# Patient Record
Sex: Male | Born: 1985 | Race: White | Hispanic: No | Marital: Married | Smoking: Former smoker
Health system: Southern US, Community
[De-identification: ages and names within clinical notes are randomized; demographics above are authoritative.]

## PROBLEM LIST (undated history)

## (undated) DIAGNOSIS — C6290 Malignant neoplasm of unspecified testis, unspecified whether descended or undescended: Secondary | ICD-10-CM

## (undated) DIAGNOSIS — G43909 Migraine, unspecified, not intractable, without status migrainosus: Secondary | ICD-10-CM

## (undated) DIAGNOSIS — E7800 Pure hypercholesterolemia, unspecified: Secondary | ICD-10-CM

## (undated) HISTORY — DX: Pure hypercholesterolemia, unspecified: E78.00

## (undated) HISTORY — PX: TUMOR REMOVAL: SHX12

## (undated) HISTORY — DX: Malignant neoplasm of unspecified testis, unspecified whether descended or undescended: C62.90

## (undated) HISTORY — DX: Migraine, unspecified, not intractable, without status migrainosus: G43.909

---

## 2005-11-05 DIAGNOSIS — C6290 Malignant neoplasm of unspecified testis, unspecified whether descended or undescended: Secondary | ICD-10-CM

## 2005-11-05 DIAGNOSIS — I82629 Acute embolism and thrombosis of deep veins of unspecified upper extremity: Secondary | ICD-10-CM

## 2005-11-05 HISTORY — DX: Acute embolism and thrombosis of deep veins of unspecified upper extremity: I82.629

## 2005-11-05 HISTORY — DX: Malignant neoplasm of unspecified testis, unspecified whether descended or undescended: C62.90

## 2012-10-06 ENCOUNTER — Telehealth: Payer: Self-pay | Admitting: Oncology

## 2012-10-06 NOTE — Telephone Encounter (Signed)
S/W PT IN REF TO NP APPT. ON 10/10/12@1 :30 RE[address]ING [address]. [name] DX-RESECTION OF MEDIASTINAL SEMINOMA 7 YRS AGO EMAILED NP PACKED

## 2012-10-06 NOTE — Telephone Encounter (Signed)
C/D 10/06/12 for appt.10/10/12 °

## 2012-10-07 ENCOUNTER — Other Ambulatory Visit: Payer: Self-pay | Admitting: Oncology

## 2012-10-07 DIAGNOSIS — C6290 Malignant neoplasm of unspecified testis, unspecified whether descended or undescended: Secondary | ICD-10-CM

## 2012-10-10 ENCOUNTER — Telehealth: Payer: Self-pay | Admitting: Oncology

## 2012-10-10 ENCOUNTER — Encounter: Payer: Self-pay | Admitting: Oncology

## 2012-10-10 ENCOUNTER — Other Ambulatory Visit (HOSPITAL_BASED_OUTPATIENT_CLINIC_OR_DEPARTMENT_OTHER): Payer: Managed Care, Other (non HMO) | Admitting: Lab

## 2012-10-10 ENCOUNTER — Ambulatory Visit: Payer: Managed Care, Other (non HMO)

## 2012-10-10 ENCOUNTER — Ambulatory Visit (HOSPITAL_BASED_OUTPATIENT_CLINIC_OR_DEPARTMENT_OTHER): Payer: Managed Care, Other (non HMO) | Admitting: Oncology

## 2012-10-10 VITALS — BP 104/58 | HR 80 | Temp 98.5°F | Resp 20 | Ht 66.5 in | Wt 136.8 lb

## 2012-10-10 DIAGNOSIS — C6290 Malignant neoplasm of unspecified testis, unspecified whether descended or undescended: Secondary | ICD-10-CM

## 2012-10-10 DIAGNOSIS — G894 Chronic pain syndrome: Secondary | ICD-10-CM

## 2012-10-10 LAB — COMPREHENSIVE METABOLIC PANEL (CC13)
Albumin: 4.5 g/dL (ref 3.5–5.0)
CO2: 26 mEq/L (ref 22–29)
Calcium: 9.7 mg/dL (ref 8.4–10.4)
Chloride: 104 mEq/L (ref 98–107)
Glucose: 135 mg/dl — ABNORMAL HIGH (ref 70–99)
Potassium: 3.6 mEq/L (ref 3.5–5.1)
Sodium: 140 mEq/L (ref 136–145)
Total Protein: 7.5 g/dL (ref 6.4–8.3)

## 2012-10-10 LAB — CBC WITH DIFFERENTIAL/PLATELET
Eosinophils Absolute: 0 10*3/uL (ref 0.0–0.5)
LYMPH%: 32.3 % (ref 14.0–49.0)
MONO#: 0.3 10*3/uL (ref 0.1–0.9)
NEUT#: 2.8 10*3/uL (ref 1.5–6.5)
Platelets: 182 10*3/uL (ref 140–400)
RBC: 5.04 10*6/uL (ref 4.20–5.82)
WBC: 4.7 10*3/uL (ref 4.0–10.3)
lymph#: 1.5 10*3/uL (ref 0.9–3.3)

## 2012-10-10 MED ORDER — HYDROCODONE-ACETAMINOPHEN 5-325 MG PO TABS: 1.0000 | ORAL_TABLET | Freq: Four times a day (QID) | ORAL | Status: DC | PRN

## 2012-10-10 NOTE — Progress Notes (Signed)
Note dictated

## 2012-10-10 NOTE — Telephone Encounter (Signed)
gv pt appt schedule for Dec 2014....Marland Kitchentried to schedule the referral however April @ [facility] [address] stated that they do not have any Dr. Claiborne Billings deals with that type of medical condition.Marland Kitcheni s/w Dr. Clelia Croft and he stated that he would handle it.Marland Kitcheni did not authorize the referral

## 2012-10-10 NOTE — Progress Notes (Signed)
Checked in new pt with no financial concerns. °

## 2012-10-11 NOTE — Progress Notes (Signed)
CC:   Alexander Rivera. Alexander Rivera, M.D.  REASON FOR CONSULTATION:  History of seminoma.  HISTORY OF PRESENT ILLNESS:  Alexander Rivera is a pleasant 26 year old gentleman, native of [place], [place], currently of [employer]. He is a very nice gentleman, currently works at [employer] and has been living in this area for the last 2 years due to his job.  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  He had a large mediastinal mass that was biopsy-proven to be seminoma.  He was treated initially with BEP chemotherapy and subsequently had thoracic surgery to remove residual mass.  That was complicated by superior vena cava syndrome; however, he recovered fairly well and has been disease-free since that time.  He has been followed by Dr. Lutricia Rivera in [place], [place].  Since his operation, he has developed chronic pain syndrome that has been tried on Percocet as well as low- dose Neurontin without really any significant benefit.  The patient relocated to [facility] and had been doing relatively fair.  He does have chronic pain but has gotten worse in the last year or so.  For that reason he was evaluated by an urgent care and was referred to me to reestablish care with Oncology.  Clinically, Alexander Rivera feels relatively well.  He is actually able to work full time without any hindrance or decline.  He is able to exercise.  He does have more of a dull, aching pain in the middle of his back and upper shoulders, rating somewhere between 3-5/10 and dull in nature, not radiating, and again alleviated slightly by hydrocodone.  He is not reporting any peripheral neuropathy. He has not reported any weakness, fatigue, or tiredness.  He did not report any chest pain, did not report any shortness of breath.  Had not had any testicular masses.  REVIEW OF SYSTEMS:  Does not report any headaches, blurred vision, double vision.  Does not report any motor or sensory  neuropathy.  Does not report any alteration in mental status.  Does not report any psychiatric issues, depression.  Does not report any fever, chills, sweats.  Does not report any cough, hemoptysis, hematemesis.  No nausea, vomiting.  Does not report any abdominal pain, hematochezia, melena, genitourinary complaints.  The rest of the review of systems is unremarkable.  PAST MEDICAL HISTORY:  Significant for germ cell tumor, presented with anterior mediastinal mass, treated with surgery with BEP chemotherapy followed by resection.  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  No history of hypertension, diabetes, coronary disease.  CURRENT MEDICATION:  He is on hydrocodone with Tylenol.  ALLERGIES:  None.  SOCIAL HISTORY:  He is married.  He does not have any alcohol or tobacco abuse.  No children.  FAMILY HISTORY:  No cancers in the immediate family, but on his father's side, there is kidney cancer and breast cancer on his mother's side.  PHYSICAL EXAMINATION:  Alert, awake gentleman appeared in no active distress.  His blood pressure today is 104/58, pulse is 80, respirations 20, temperature is 98.  Weight is 135 pounds.  ECOG performance status is 0.  HEENT:  Head is normocephalic, atraumatic.  Pupils equal, round, reactive to light.  Oral mucosa moist and pink.  Neck:  Supple without adenopathy.  Heart:  Regular rate and rhythm.  S1, S2.  Lungs:  Clear to auscultation.  No rhonchi, wheezing, dullness to percussion.  Abdomen: Soft, nontender.  No hepatosplenomegaly.  Extremities:  No clubbing,  cyanosis, or edema.  Neurologic:  Intact motor, sensory, and deep tendon reflexes.  LABORATORY DATA:  Today showed a hemoglobin of 15, white cell count of 4.7, platelet count 182.  He had normal chemistry, normal liver function tests.  Tumor markers currently pending.  ASSESSMENT/PLAN:  A 26 year old gentleman presented with the following issues: 1. He has a  history of a mediastinal seminoma presented with an     anterior mediastinal mass.  Treated with chemotherapy followed by     surgical resection.  I had a lengthy discussion today discussing     the natural course of these particular tumors as well as the     treatment for it.  Certainly, he had an excellent treatment and     appears to be in remission.  His last CT scan was done in 2011,     done in [place], [place], and had no evidence of any     recurrent disease.  At this time, I will continue his active     surveillance.  Given the fact that he has not had any imaging     studies, given the fact that he had increased pain, I would like to     image his chest 1 more time.  If his chest is clear at this time, I     would do annual chest x-rays up until 10 years, which will be taken     to 2017.  Once that has continued to be disease-free at that time,     we will suspend any imaging studies.  I have continued to advise     him to continue with age-appropriate cancer screening, and he will     follow up with me annually. 2. Chronic pain syndrome.  Undoubtedly related to his tumor and     surgery.  At this time, I think it would Alexander Rivera to see a     physical medicine and rehab and chronic pain clinic from that     standpoint, as we are probably looking at a life-term need for pain     Medication. I will make the referral today.  I     will communicate the findings of his imaging studies once those are     completed.    ______________________________ Alexander Rivera, M.D. FNS/MEDQ  D:  10/10/2012  T:  10/11/2012  Job:  [id number]

## 2012-10-13 LAB — BETA HCG QUANT (REF LAB): Beta hCG, Tumor Marker: <0.5 m[IU]/mL (ref <5.0)

## 2012-10-13 LAB — AFP TUMOR MARKER: AFP-Tumor Marker: 2.3 ng/mL (ref 0.0–8.0)

## 2012-10-14 ENCOUNTER — Encounter: Payer: Self-pay | Admitting: *Deleted

## 2012-10-14 NOTE — Progress Notes (Signed)
Faxed patient's chart to anne, @ [facility].  # [phone]. Patient notified.

## 2012-10-24 NOTE — Progress Notes (Signed)
Faxed progress notes, demographics, and recent labs to [facility] [phone]--fax / [phone]--office)

## 2012-11-03 ENCOUNTER — Encounter: Payer: Self-pay | Admitting: *Deleted

## 2012-11-03 NOTE — Progress Notes (Signed)
Called [facility] and asked when patient would be scheduled? Was told that his insurance [employer], was not in their network. i called patient and asked him to call the customer service # on the back of his ins card and ask what pain clinics they will cover. He will call me back, after speaking with customer service.

## 2012-11-03 NOTE — Progress Notes (Deleted)
Called [facility] and asked when patient would be scheduled? Was told that his insurance [employer], was not in their network. i called patient and asked him to call the customer service # on the back of his ins card and ask what pain clinics they will cover. He will call me back, after speaking with customer service. 

## 2012-11-04 ENCOUNTER — Telehealth: Payer: Self-pay | Admitting: *Deleted

## 2012-11-04 ENCOUNTER — Other Ambulatory Visit: Payer: Self-pay | Admitting: *Deleted

## 2012-11-04 DIAGNOSIS — C6290 Malignant neoplasm of unspecified testis, unspecified whether descended or undescended: Secondary | ICD-10-CM

## 2012-11-04 MED ORDER — HYDROCODONE-ACETAMINOPHEN 5-325 MG PO TABS: 1.0000 | ORAL_TABLET | Freq: Four times a day (QID) | ORAL | Status: DC | PRN

## 2012-11-04 NOTE — Telephone Encounter (Signed)
Wife Aram Beecham calling to say she has a list of medical facilities that are in the [facility] network. Patients O.V. Notes faxed to [facility] [address]  Fax [phone]. They have several clinics [place] and that is why the fax is a [phone] #.

## 2012-11-04 NOTE — Telephone Encounter (Signed)
Per dr Clelia Croft okay to call in vicodin [phone] until patient can get into the pain clinic. Patient notified.

## 2013-10-13 ENCOUNTER — Ambulatory Visit: Payer: Managed Care, Other (non HMO) | Admitting: Oncology

## 2013-10-13 ENCOUNTER — Other Ambulatory Visit: Payer: Self-pay | Admitting: Oncology

## 2013-10-13 ENCOUNTER — Other Ambulatory Visit: Payer: Managed Care, Other (non HMO)

## 2013-10-13 DIAGNOSIS — C6290 Malignant neoplasm of unspecified testis, unspecified whether descended or undescended: Secondary | ICD-10-CM

## 2013-12-24 ENCOUNTER — Emergency Department (HOSPITAL_COMMUNITY)
Admission: EM | Admit: 2013-12-24 | Discharge: 2013-12-24 | Disposition: A | Discharge status: Home / Self Care | Payer: Managed Care, Other (non HMO) | Attending: Emergency Medicine | Admitting: Emergency Medicine

## 2013-12-24 ENCOUNTER — Encounter (HOSPITAL_COMMUNITY): Payer: Self-pay | Admitting: Emergency Medicine

## 2013-12-24 DIAGNOSIS — T628X1A Toxic effect of other specified noxious substances eaten as food, accidental (unintentional), initial encounter: Secondary | ICD-10-CM | POA: Insufficient documentation

## 2013-12-24 DIAGNOSIS — Y929 Unspecified place or not applicable: Secondary | ICD-10-CM | POA: Insufficient documentation

## 2013-12-24 DIAGNOSIS — Y9389 Activity, other specified: Secondary | ICD-10-CM | POA: Insufficient documentation

## 2013-12-24 DIAGNOSIS — T7840XA Allergy, unspecified, initial encounter: Secondary | ICD-10-CM

## 2013-12-24 DIAGNOSIS — R209 Unspecified disturbances of skin sensation: Secondary | ICD-10-CM | POA: Insufficient documentation

## 2013-12-24 DIAGNOSIS — L509 Urticaria, unspecified: Secondary | ICD-10-CM | POA: Insufficient documentation

## 2013-12-24 DIAGNOSIS — Z87891 Personal history of nicotine dependence: Secondary | ICD-10-CM | POA: Insufficient documentation

## 2013-12-24 LAB — BASIC METABOLIC PANEL
BUN: 19 mg/dL (ref 6–23)
CO2: 25 mEq/L (ref 19–32)
Calcium: 9.9 mg/dL (ref 8.4–10.5)
Chloride: 99 mEq/L (ref 96–112)
Creatinine, Ser: 0.86 mg/dL (ref 0.50–1.35)
GFR calc non Af Amer: >90 mL/min (ref >90)
Glucose, Bld: 107 mg/dL — ABNORMAL HIGH (ref 70–99)
Potassium: 3.8 mEq/L (ref 3.7–5.3)
SODIUM: 138 meq/L (ref 137–147)

## 2013-12-24 LAB — CBC WITH DIFFERENTIAL/PLATELET
Basophils Absolute: 0 10*3/uL (ref 0.0–0.1)
Basophils Relative: 0 % (ref 0–1)
Eosinophils Absolute: 0.1 10*3/uL (ref 0.0–0.7)
Eosinophils Relative: 1 % (ref 0–5)
HCT: 46.1 % (ref 39.0–52.0)
HEMOGLOBIN: 16.1 g/dL (ref 13.0–17.0)
LYMPHS ABS: 2.4 10*3/uL (ref 0.7–4.0)
Lymphocytes Relative: 38 % (ref 12–46)
MCH: 30.6 pg (ref 26.0–34.0)
MCHC: 34.9 g/dL (ref 30.0–36.0)
MCV: 87.5 fL (ref 78.0–100.0)
MONOS PCT: 7 % (ref 3–12)
Monocytes Absolute: 0.4 10*3/uL (ref 0.1–1.0)
NEUTROS PCT: 54 % (ref 43–77)
Neutro Abs: 3.4 10*3/uL (ref 1.7–7.7)
Platelets: 205 10*3/uL (ref 150–400)
RBC: 5.27 MIL/uL (ref 4.22–5.81)
RDW: 13.6 % (ref 11.5–15.5)
WBC: 6.3 10*3/uL (ref 4.0–10.5)

## 2013-12-24 MED ORDER — DIPHENHYDRAMINE HCL 50 MG/ML IJ SOLN
25.0000 mg | Freq: Once | INTRAMUSCULAR | Status: AC
  Administered 2013-12-24: 25 mg via INTRAVENOUS
  Filled 2013-12-24: qty 1

## 2013-12-24 MED ORDER — METHYLPREDNISOLONE SODIUM SUCC 125 MG IJ SOLR
125.0000 mg | Freq: Once | INTRAMUSCULAR | Status: AC
  Administered 2013-12-24: 125 mg via INTRAVENOUS
  Filled 2013-12-24: qty 2

## 2013-12-24 MED ORDER — SODIUM CHLORIDE 0.9 % IV SOLN
Freq: Once | INTRAVENOUS | Status: AC
  Administered 2013-12-24: 22:00:00 via INTRAVENOUS

## 2013-12-24 MED ORDER — FAMOTIDINE IN NACL 20-0.9 MG/50ML-% IV SOLN
20.0000 mg | Freq: Once | INTRAVENOUS | Status: AC
  Administered 2013-12-24: 20 mg via INTRAVENOUS
  Filled 2013-12-24: qty 50

## 2013-12-24 NOTE — ED Provider Notes (Signed)
CSN: [id number]     Arrival date & time 12/24/13  2138 History   First MD Initiated Contact with Patient 12/24/13 2148     Chief Complaint  Patient presents with  . Allergic Reaction     (Consider location/radiation/quality/duration/timing/severity/associated sxs/prior Treatment) HPI Comments: Patient is a 28 year old male who presents with a suspected allergic reaction about 20 minutes prior to arrival. Symptoms started gradually and progressively worsened since the onset. Patient reports lip tingling, bilateral hand tingling, generalized skin erythema, and itching. Patient is present with his wife who states they ate [place] food about 1.5 hours prior to arrival. Patient has a known allergy to bee venom and no other allergies. The only recent medication change was a generic Clonidine patch from the brand name he used to use that he switched earlier today. Patient denies any other new detergents, lotions, soaps. Patient denies any wheezing, SOB, throat closing.    History reviewed. No pertinent past medical history. Past Surgical History  Procedure Laterality Date  . Tumor removal      Chest   No family history on file. History  Substance Use Topics  . Smoking status: Former Smoker    Types: Cigarettes    Quit date: 11/05/2008  . Smokeless tobacco: Never Used  . Alcohol Use: 0.6 oz/week    1 Cans of beer per week    Review of Systems  Constitutional: Negative for fever, chills and fatigue.  HENT: Negative for trouble swallowing.   Eyes: Negative for visual disturbance.  Respiratory: Negative for shortness of breath.   Cardiovascular: Negative for chest pain and palpitations.  Gastrointestinal: Negative for nausea, vomiting, abdominal pain and diarrhea.  Genitourinary: Negative for dysuria and difficulty urinating.  Musculoskeletal: Negative for arthralgias and neck pain.  Skin: Positive for rash. Negative for color change.  Neurological: Negative for dizziness and weakness.   Psychiatric/Behavioral: Negative for dysphoric mood.      Allergies  Review of patient's allergies indicates no known allergies.  Home Medications   Current Outpatient Rx  Name  Route  Sig  Dispense  Refill  . HYDROcodone-acetaminophen (NORCO/VICODIN) 5-325 MG per tablet   Oral   Take 1 tablet by mouth every 6 (six) hours as needed.   30 tablet   0    BP 132/89  Temp(Src) 98.3 F (36.8 C) (Oral)  Resp 15  Ht 5\' 7"  (1.702 m)  Wt 135 lb (61.236 kg)  BMI 21.14 kg/m2  SpO2 98% Physical Exam  Nursing note and vitals reviewed. Constitutional: He is oriented to person, place, and time. He appears well-developed and well-nourished. No distress.  HENT:  Head: Normocephalic and atraumatic.  Mouth/Throat: Oropharynx is clear and moist. No oropharyngeal exudate.  Eyes: Conjunctivae and EOM are normal.  Neck: Normal range of motion.  Cardiovascular: Normal rate and regular rhythm.  Exam reveals no gallop and no friction rub.   No murmur heard. Pulmonary/Chest: Effort normal and breath sounds normal. He has no wheezes. He has no rales. He exhibits no tenderness.  Abdominal: Soft. There is no tenderness.  Musculoskeletal: Normal range of motion.  Neurological: He is alert and oriented to person, place, and time. Coordination normal.  Speech is goal-oriented. Moves limbs without ataxia.   Skin: Skin is warm and dry.  Generalized erythema and scattered urticarial lesions of back, abdomen, chest, neck and face. No lip or tongue edema noted.   Psychiatric: He has a normal mood and affect. His behavior is normal.    ED Course  Procedures (including critical care time) Labs Review Labs Reviewed  BASIC METABOLIC PANEL - Abnormal; Notable for the following:    Glucose, Bld 107 (*)    All other components within normal limits  CBC WITH DIFFERENTIAL   Imaging Review No results found.  EKG Interpretation   None       MDM   Final diagnoses:  Allergic reaction    9:55  PM Patient will have solumedrol, benadryl, and pepcid for allergic reaction. Vitals stable and patient afebrile. Patient not wheezing, having throat closing, or having any signs of respiratory distress.  11:17 PM Patient symptoms have resolved and he is feeling better. Patient will follow up with his PCP for further evaluation and possible medication changes. No further evaluation needed at this time.     Alvina Chou, PA-C 12/24/13 2319

## 2013-12-24 NOTE — Discharge Instructions (Signed)
Follow up with your doctor. Take benadryl at home if symptoms return. Return to the ED with worsening or concerning symptoms.

## 2013-12-24 NOTE — ED Notes (Signed)
Pt reports that he started having lip tingling, bilateral hand tingling, generalized redness, and generalized itching about twenty minutes prior to arrival. Pt reports hives everywhere. Pt reports an allergy to bee venom, however that was as a child. Pt denies any other known allergies.

## 2013-12-27 NOTE — ED Provider Notes (Signed)
Medical screening examination/treatment/procedure(s) were performed by non-physician practitioner and as supervising physician I was immediately available for consultation/collaboration.  EKG Interpretation   None         Houston Siren III, MD 12/27/13 [phone]

## 2015-12-29 ENCOUNTER — Ambulatory Visit (INDEPENDENT_AMBULATORY_CARE_PROVIDER_SITE_OTHER): Payer: Managed Care, Other (non HMO) | Admitting: Family Medicine

## 2015-12-29 VITALS — BP 114/64 | HR 91 | Temp 98.7°F | Resp 16 | Ht 67.0 in | Wt 159.0 lb

## 2015-12-29 DIAGNOSIS — G894 Chronic pain syndrome: Secondary | ICD-10-CM

## 2015-12-29 DIAGNOSIS — Z9889 Other specified postprocedural states: Secondary | ICD-10-CM

## 2015-12-29 MED ORDER — PREGABALIN 75 MG PO CAPS: 75.0000 mg | ORAL_CAPSULE | Freq: Three times a day (TID) | ORAL | Status: DC

## 2015-12-29 NOTE — Progress Notes (Signed)
By signing my name below, I, Moises Blood, attest that this documentation has been prepared under the direction and in the presence of Robyn Haber, MD. Electronically Signed: Moises Blood, Elida. 12/29/2015 , 4:25 PM .  Patient was seen in room 12 .   Patient ID: Alexander Rivera MRN: [id number], DOB: 1986-09-20, 30 y.o. Date of Encounter: 12/29/2015  Primary Physician: Gelene Mink, MD  Chief Complaint:  Chief Complaint  Patient presents with  . Medication Refill    Lyrica    HPI:  Daishaun Mawhinney is a 30 y.o. male who presents to [facility] for medication refill on lyrica. He had cancer (mediastinal seminoma) when he was 30 years old. He had back surgery and there was a lot of residual pain. He takes lyric 75mg  tid. He's a patient at [facility] pain management, but his insurance charges the visit as specialist. So, he was advised to come to urgent care and family medicine for the prescription.   He works at [employer], in the Psychologist, educational.   History reviewed. No pertinent past medical history.   Home Meds: Prior to Admission medications   Medication Sig Start Date End Date Taking? Authorizing Provider  cloNIDine (CATAPRES - DOSED IN MG/24 HR) 0.1 mg/24hr patch Place 0.1 mg onto the skin once a week. Reported on 12/29/2015 12/23/13   Historical Provider, MD  oxyCODONE (OXY IR/ROXICODONE) 5 MG immediate release tablet Take 5 mg by mouth 2 (two) times daily. Reported on 12/29/2015 12/11/13   Historical Provider, MD  ranitidine (ZANTAC) 150 MG tablet Take 150 mg by mouth 2 (two) times daily as needed for heartburn. Reported on 12/29/2015    Historical Provider, MD    Allergies: No Known Allergies  Social History   Social History  . Marital Status: Married    Spouse Name: N/A  . Number of Children: N/A  . Years of Education: N/A   Occupational History  . Not on file.   Social History Main Topics  . Smoking status: Former Smoker    Types: Cigarettes      Quit date: 11/05/2008  . Smokeless tobacco: Never Used  . Alcohol Use: 0.6 oz/week    1 Cans of beer per week  . Drug Use: No  . Sexual Activity: Not on file   Other Topics Concern  . Not on file   Social History Narrative     Review of Systems: Constitutional: negative for fever, chills, night sweats, weight changes, or fatigue  HEENT: negative for vision changes, hearing loss, congestion, rhinorrhea, ST, epistaxis, or sinus pressure Cardiovascular: negative for chest pain or palpitations Respiratory: negative for hemoptysis, wheezing, shortness of breath, or cough Abdominal: negative for abdominal pain, nausea, vomiting, diarrhea, or constipation Dermatological: negative for rash Musc: positive for back pain Neurologic: negative for headache, dizziness, or syncope All other systems reviewed and are otherwise negative with the exception to those above and in the HPI.  Physical Exam: Blood pressure 114/64, pulse 91, temperature 98.7 F (37.1 C), resp. rate 16, height 5\' 7"  (1.702 m), weight 159 lb (72.122 kg), SpO2 98 %., Body mass index is 24.9 kg/(m^2). General: Well developed, well nourished, in no acute distress. Head: Normocephalic, atraumatic, eyes without discharge, sclera non-icteric, nares are without discharge. Bilateral auditory canals clear, TM's are without perforation, pearly grey and translucent with reflective cone of light bilaterally. Oral cavity moist, posterior pharynx without exudate, erythema, peritonsillar abscess, or post nasal drip.  Neck: Supple. No thyromegaly. Full ROM. No  lymphadenopathy. Lungs: Clear bilaterally to auscultation without wheezes, rales, or rhonchi. Breathing is unlabored. Heart: RRR with S1 S2. No murmurs, rubs, or gallops appreciated. Abdomen: Soft, non-tender, non-distended with normoactive bowel sounds. No hepatomegaly. No rebound/guarding. No obvious abdominal masses. Msk:  Strength and tone normal for age. Extremities/Skin: Warm  and dry. No clubbing or cyanosis. No edema. No rashes or suspicious lesions. Neuro: Alert and oriented X 3. Moves all extremities spontaneously. Gait is normal. CNII-XII grossly in tact. Psych:  Responds to questions appropriately with a normal affect.    ASSESSMENT AND PLAN:  30 y.o. year old male with  This chart was scribed in my presence and reviewed by me personally.    ICD-9-CM ICD-10-CM   1. Chronic pain syndrome 338.4 G89.4 pregabalin (LYRICA) 75 MG capsule  2. S/P thoracotomy V45.89 [id number]      Signed, Robyn Haber, MD     Signed, Robyn Haber, MD 12/29/2015 4:25 PM

## 2016-03-29 ENCOUNTER — Telehealth: Payer: Self-pay

## 2016-03-29 DIAGNOSIS — G894 Chronic pain syndrome: Secondary | ICD-10-CM

## 2016-03-29 MED ORDER — PREGABALIN 75 MG PO CAPS: 75.0000 mg | ORAL_CAPSULE | Freq: Three times a day (TID) | ORAL | Status: DC

## 2016-03-29 NOTE — Telephone Encounter (Signed)
Pt called in concerning his prescription Lyrica. He came in on 12/29/15 and saw Dr. Joseph Art for a medication refill of this medication. It was suppose to be sent in by mail order but it never was. Pt is now in need of the medication and would like this refill done. I advised patient of Dr. Pauletta Browns retiring and we would have to put in a message in concerning the refill. Pt's wife called back in very I rate saying that we dropped the ball on this one & she refuses to wait 24-48 hours for a response. Pt states that they will be in to take care of this mess. Gave wife office hours to stop by whenever it was convenient.

## 2016-03-29 NOTE — Telephone Encounter (Signed)
SPoke with pt, advised I  Rx was sent in again.

## 2016-10-15 ENCOUNTER — Telehealth: Payer: Self-pay | Admitting: Family Medicine

## 2016-10-15 ENCOUNTER — Ambulatory Visit (INDEPENDENT_AMBULATORY_CARE_PROVIDER_SITE_OTHER): Payer: Managed Care, Other (non HMO) | Admitting: Physician Assistant

## 2016-10-15 VITALS — BP 122/78 | HR 82 | Temp 98.6°F | Resp 17 | Ht 67.0 in | Wt 159.0 lb

## 2016-10-15 DIAGNOSIS — G894 Chronic pain syndrome: Secondary | ICD-10-CM | POA: Diagnosis not present

## 2016-10-15 MED ORDER — PREGABALIN 75 MG PO CAPS: 75.0000 mg | ORAL_CAPSULE | Freq: Three times a day (TID) | ORAL | 3 refills | Status: DC

## 2016-10-15 NOTE — Patient Instructions (Signed)
     IF you received an x-ray today, you will receive an invoice from [facility]. Please contact [facility] at [phone] with questions or concerns regarding your invoice.   IF you received labwork today, you will receive an invoice from [facility]. Please contact [facility] at [phone] with questions or concerns regarding your invoice.   Our billing staff will not be able to assist you with questions regarding bills from these companies.  You will be contacted with the lab results as soon as they are available. The fastest way to get your results is to activate your My Chart account. Instructions are located on the last page of this paperwork. If you have not heard from us regarding the results in 2 weeks, please contact this office.      

## 2016-10-15 NOTE — Progress Notes (Signed)
   10/15/2016 5:54 PM   DOB: June 03, 1986 / MRN: [id number]  SUBJECTIVE:  Alexander Rivera is a 30 y.o. male presenting for lyrica refills.  REports a history of mass occupying lesion in his chest at 30 years of age.  He was living in [place] that required surgery at the [facility] clinic along with chemotherapy.  He did not require radiation. Reports a history of chornic pain 2/2 to this and has required narcotics in the past however has found lyrica controls his symptoms. Denies any dizziness and narcolepsy with lyrica.  He has not been missing doses.  He has also tried tylenol and ibuprofen that does not really helped.   He has No Known Allergies.   He  has no past medical history on file.    He  reports that he quit smoking about 7 years ago. His smoking use included Cigarettes. He has never used smokeless tobacco. He reports that he drinks about 0.6 oz of alcohol per week . He reports that he does not use drugs. He  has no sexual activity history on file. The patient  has a past surgical history that includes Tumor removal.  His family history is not on file.  Review of Systems  Constitutional: Negative for fever.  Respiratory: Negative for sputum production and shortness of breath.   Cardiovascular: Negative for leg swelling.  Musculoskeletal: Negative for myalgias.  Neurological: Negative for dizziness.    The problem list and medications were reviewed and updated by myself where necessary and exist elsewhere in the encounter.   OBJECTIVE:  BP 122/78 (BP Location: Right Arm, Patient Position: Sitting, Cuff Size: Normal)   Pulse 82   Temp 98.6 F (37 C) (Oral)   Resp 17   Ht 5\' 7"  (1.702 m)   Wt 159 lb (72.1 kg)   SpO2 100%   BMI 24.90 kg/m   Physical Exam  Constitutional: He is oriented to person, place, and time. He appears well-developed and well-nourished. No distress.  Cardiovascular: Normal rate, regular rhythm and normal heart sounds.   Pulmonary/Chest: Effort normal  and breath sounds normal.    Musculoskeletal: Normal range of motion.  Neurological: He is alert and oriented to person, place, and time.  Skin: Skin is warm. He is not diaphoretic.  Psychiatric: He has a normal mood and affect.    No results found for this or any previous visit (from the past 72 hour(s)).  No results found.  ASSESSMENT AND PLAN  Alexander Rivera was seen today for medication refill.  Diagnoses and all orders for this visit:  Chronic pain syndrome: He is well managed on pregabalin.  I will maintain this for him as he is well controlled and is not taking any narcotics. He will come back in one year. Will consider adding duloxetine to his regimen to potentially reduce the dose of pregabalin, however he is already taking less than 1/2 the maximum dose.  -     pregabalin (LYRICA) 75 MG capsule; Take 1 capsule (75 mg total) by mouth 3 (three) times daily.    The patient is advised to call or return to clinic if he does not see an improvement in symptoms, or to seek the care of the closest emergency department if he worsens with the above plan.   Philis Fendt, MHS, PA-C [facility] 10/15/2016 5:54 PM

## 2016-10-15 NOTE — Addendum Note (Signed)
Addended by: Candie Chroman D on: 10/15/2016 07:11 PM   Modules accepted: Orders

## 2016-10-15 NOTE — Telephone Encounter (Signed)
Pt wife called stating that [facility] wants Alexander Rivera to escript his RX for 90 day Lyrica instead of faxing RX I faxed it earlier for Alexander Rivera pt will bring RX back here tonight

## 2016-10-15 NOTE — Telephone Encounter (Signed)
Called pt to let him know that RX will be called into [facility] first thing tomorrow morning

## 2017-07-10 ENCOUNTER — Ambulatory Visit (INDEPENDENT_AMBULATORY_CARE_PROVIDER_SITE_OTHER): Payer: Managed Care, Other (non HMO) | Admitting: Physician Assistant

## 2017-07-10 ENCOUNTER — Encounter: Payer: Self-pay | Admitting: Physician Assistant

## 2017-07-10 VITALS — BP 124/85 | HR 89 | Temp 98.2°F | Resp 16 | Ht 67.0 in | Wt 150.8 lb

## 2017-07-10 DIAGNOSIS — C6290 Malignant neoplasm of unspecified testis, unspecified whether descended or undescended: Secondary | ICD-10-CM

## 2017-07-10 DIAGNOSIS — L918 Other hypertrophic disorders of the skin: Secondary | ICD-10-CM | POA: Diagnosis not present

## 2017-07-10 DIAGNOSIS — G894 Chronic pain syndrome: Secondary | ICD-10-CM | POA: Diagnosis not present

## 2017-07-10 MED ORDER — PREGABALIN 75 MG PO CAPS: 75.0000 mg | ORAL_CAPSULE | Freq: Three times a day (TID) | ORAL | 3 refills | Status: DC

## 2017-07-10 MED ORDER — DULOXETINE HCL 20 MG PO CPEP: ORAL_CAPSULE | ORAL | 3 refills | Status: DC

## 2017-07-10 NOTE — Patient Instructions (Signed)
Continue lyrica at the same dose for three week.  After the third week eliminate the mid day dose.  After another three week eliminate the morning dose, and after another three weeks eliminate the evening dose.

## 2017-07-10 NOTE — Progress Notes (Signed)
    07/10/2017 3:07 PM   DOB: 1986-04-07 / MRN: [id number]  SUBJECTIVE:  Alexander Rivera is a 31 y.o. male presenting for recheck of chronic chest pain secondary to surgery.  Wants to try coming off gabaergics and try something that would not increase the risk of cognitive decline in the future.   Has a mole on the back of his head he would like addressed.   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    He has No Known Allergies.   He  has no past medical history on file.    He  reports that he quit smoking about 8 years ago. His smoking use included Cigarettes. He has never used smokeless tobacco. He reports that he drinks about 0.6 oz of alcohol per week . He reports that he does not use drugs. He  has no sexual activity history on file. The patient  has a past surgical history that includes Tumor removal.  His family history is not on file.  Review of Systems  Constitutional: Negative for chills and fever.  HENT: Negative for sore throat.   Genitourinary: Negative for dysuria.  Skin: Negative for itching and rash.  Neurological: Negative for dizziness.  Psychiatric/Behavioral: Negative for depression.    The problem list and medications were reviewed and updated by myself where necessary and exist elsewhere in the encounter.   OBJECTIVE:  BP 124/85 (BP Location: Right Arm, Patient Position: Sitting, Cuff Size: Normal)   Pulse 89   Temp 98.2 F (36.8 C) (Oral)   Resp 16   Ht 5\' 7"  (1.702 m)   Wt 150 lb 12.8 oz (68.4 kg)   SpO2 98%   BMI 23.62 kg/m   Physical Exam  Constitutional: He appears well-developed. He is active and cooperative.  Non-toxic appearance.  Cardiovascular: Normal rate.   Pulmonary/Chest: Effort normal. No tachypnea.  Neurological: He is alert.  Skin: Skin is warm and dry. He is not diaphoretic. No pallor.  Vitals reviewed.   No results found for this or any previous visit (from the past 72 hour(s)).  No results  found.  ASSESSMENT AND PLAN:  Alexander Rivera was seen today for medication refill and referral.  Diagnoses and all orders for this visit:  Seminoma [facility]) -     Ambulatory referral to Gynecologic Oncology -     hCG, serum, qualitative  Chronic pain syndrome: see avs.  Will attempt to take him off gabaergic over the next 9 weeks.  -     DULoxetine (CYMBALTA) 20 MG capsule; Start 20 mg for three weeks then start taking 40 mg and continue.  Always take in the morning. -     pregabalin (LYRICA) 75 MG capsule; Take 1 capsule (75 mg total) by mouth 3 (three) times daily.  Skin tag: Golden Circle off after administering numbing medication. Will send for path.  -     Surgical pathology    The patient is advised to call or return to clinic if he does not see an improvement in symptoms, or to seek the care of the closest emergency department if he worsens with the above plan.   Philis Fendt, MHS, PA-C [facility] 07/10/2017 3:07 PM

## 2017-07-10 NOTE — Addendum Note (Signed)
Addended by: Tereasa Coop on: 07/10/2017 06:53 PM   Modules accepted: Orders

## 2017-07-11 ENCOUNTER — Encounter: Payer: Self-pay | Admitting: Radiology

## 2017-07-11 ENCOUNTER — Telehealth: Payer: Self-pay | Admitting: Physician Assistant

## 2017-07-11 LAB — HCG, SERUM, QUALITATIVE: hCG,Beta Subunit,Qual,Serum: NEGATIVE m[IU]/mL

## 2017-07-11 MED ORDER — PREGABALIN 75 MG PO CAPS: 75.0000 mg | ORAL_CAPSULE | Freq: Three times a day (TID) | ORAL | 3 refills | Status: DC

## 2017-07-11 NOTE — Addendum Note (Signed)
Addended by: Tereasa Coop on: 07/11/2017 03:44 PM   Modules accepted: Orders

## 2017-07-11 NOTE — Telephone Encounter (Signed)
Routing error.  Sent to clinical pool.  Sorry!

## 2017-07-11 NOTE — Telephone Encounter (Signed)
Pt's wife called stating that [facility] has not received the script for lyrica.  Please resend script to [facility]

## 2017-07-12 NOTE — Telephone Encounter (Signed)
Rx faxed to [facility] on 07/11/2017./ S.Trayven Lumadue,CMA

## 2019-06-11 ENCOUNTER — Telehealth: Payer: Managed Care, Other (non HMO)

## 2019-06-16 ENCOUNTER — Telehealth: Payer: Self-pay

## 2019-06-16 NOTE — Telephone Encounter (Signed)
error 

## 2019-06-18 ENCOUNTER — Other Ambulatory Visit: Payer: Self-pay

## 2019-06-18 ENCOUNTER — Encounter: Payer: Self-pay | Admitting: Family Medicine

## 2019-06-18 ENCOUNTER — Ambulatory Visit (INDEPENDENT_AMBULATORY_CARE_PROVIDER_SITE_OTHER): Payer: Managed Care, Other (non HMO) | Admitting: Family Medicine

## 2019-06-18 VITALS — BP 110/78 | HR 115 | Temp 98.4°F | Ht 67.0 in | Wt 144.2 lb

## 2019-06-18 DIAGNOSIS — G8929 Other chronic pain: Secondary | ICD-10-CM | POA: Diagnosis not present

## 2019-06-18 DIAGNOSIS — C6290 Malignant neoplasm of unspecified testis, unspecified whether descended or undescended: Secondary | ICD-10-CM

## 2019-06-18 DIAGNOSIS — N50811 Right testicular pain: Secondary | ICD-10-CM | POA: Diagnosis not present

## 2019-06-18 DIAGNOSIS — Z113 Encounter for screening for infections with a predominantly sexual mode of transmission: Secondary | ICD-10-CM | POA: Diagnosis not present

## 2019-06-18 DIAGNOSIS — M545 Low back pain: Secondary | ICD-10-CM

## 2019-06-18 DIAGNOSIS — Z8547 Personal history of malignant neoplasm of testis: Secondary | ICD-10-CM

## 2019-06-18 LAB — POCT URINALYSIS DIP (PROADVANTAGE DEVICE)
Bilirubin, UA: NEGATIVE
Glucose, UA: NEGATIVE mg/dL
Ketones, POC UA: NEGATIVE mg/dL
Leukocytes, UA: NEGATIVE
Nitrite, UA: NEGATIVE
Protein Ur, POC: NEGATIVE mg/dL
Specific Gravity, Urine: 1.025
Urobilinogen, Ur: NEGATIVE
pH, UA: 6 (ref 5.0–8.0)

## 2019-06-18 MED ORDER — DOXYCYCLINE HYCLATE 100 MG PO TABS: 100.0000 mg | ORAL_TABLET | Freq: Two times a day (BID) | ORAL | 0 refills | Status: DC

## 2019-06-18 NOTE — Progress Notes (Signed)
Subjective:    Patient ID: Alexander Rivera, male    DOB: 13-Jul-1986, 33 y.o.   MRN: [id number]  HPI Chief Complaint  Patient presents with  . New Patient (Initial Visit)    etsbalish care and referral to oncology    He is new to the practice and here to establish care.  Requests referral back to Dr. Alen Blew and oncology due to history of testicular cancer. Previous medical care: none in the past 2 years.   Other providers: Has been under the care of Dr. Alen Blew, oncologist but has not been there in the past 5 years.  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 Had chemotherapy and surgery to remove the tumor at age 62. He had follow up annually and then  Surgery was done at the [facility].   Complains of pain in his right testicle for the past 2 months. Top of his right testicle is tender to palpation.  Pain is relatively constant.  He denies urinary symptoms.  No penile discharge. Requests STD testing.  He also has low back pain and states this was similar back pain as when he diagnosed with cancer.  Started years ago. Now becoming more frequent. Pain is worse with certain movements especially when bending over. Keeps him from sleeping lately.   Denies numbness, tingling, weakness.  No saddle anesthesia.  No urinary retention or loss of control of bowels or bladder.  Denies fever, chills, night sweats, fatigue, dizziness, unexplained weight loss, chest pain, palpitations, shortness of breath, cough, abdominal pain, nausea, vomiting, diarrhea.  Takes Excedrin.  States he has had PT for back pain as well as seen a Restaurant manager, fast food.   Social history: Lives with wife. No kids.  works as a Teacher, music for [employer].  Denies smoking, drinking alcohol, drug use   Reviewed allergies, medications, past medical, surgical, family, and social history.    Review of Systems  Pertinent positives and negatives in the history of present illness.      Objective:   Physical Exam BP 110/78   Pulse (!) 115   Temp 98.4 F (36.9 C)   Ht 5\' 7"  (1.702 m)   Wt 144 lb 3.2 oz (65.4 kg)   SpO2 95%   BMI 22.58 kg/m   Alert and in no distress.  Cardiac exam shows a regular rhythm without murmurs or gallops. Lungs are clear to auscultation.  Abdomen is soft, nondistended, normal bowel sounds, nontender, no guarding or rebound, no palpable masses.  GU exam with chaperone present reveals superior and slightly posterior right testicular tenderness over the epididymis.  Normal exam otherwise.       Assessment & Plan:  Pain in right testicle - Plan: CBC with Differential/Platelet, Comprehensive metabolic panel, POCT Urinalysis DIP (Proadvantage Device), RPR, HIV Antibody (routine testing w rflx), GC/Chlamydia Probe Amp, US SCROTUM W/DOPPLER, doxycycline (VIBRA-TABS) 100 MG tablet, Ambulatory referral to Urology, CANCELED: US Scrotum, history and exam consistent with epididymitis.  Treat with doxycycline.  Follow-up pending results.  He is aware that if his pain becomes severe and unrelenting then he should go to the emergency department for immediate evaluation.  Seminoma ([place]) - Plan: Ambulatory referral to Oncology, Ambulatory referral to Urology  Chronic midline low back pain without sciatica - Plan: CBC with Differential/Platelet, Comprehensive metabolic panel, POCT Urinalysis DIP (Proadvantage Device), DG Lumbar Spine Complete, appears to be musculoskeletal related.  Discussed supportive care.  Lumbar spine x-ray ordered.  Follow-up pending results.  Consider PT  versus Ortho referral.  Screen for STD (sexually transmitted disease) - Plan: GC/Chlamydia Probe Amp, Ambulatory referral to Oncology, started on doxycycline for epididymitis.  Follow-up pending results.  History of testicular cancer - Plan: Ambulatory referral to Oncology, Ambulatory referral to Urology, follow-up pending results of ultrasound and labs.

## 2019-06-19 LAB — COMPREHENSIVE METABOLIC PANEL
ALT: 21 IU/L (ref 0–44)
AST: 26 IU/L (ref 0–40)
Albumin/Globulin Ratio: 1.7 (ref 1.2–2.2)
Albumin: 4.9 g/dL (ref 4.0–5.0)
Alkaline Phosphatase: 73 IU/L (ref 39–117)
BUN/Creatinine Ratio: 11 (ref 9–20)
BUN: 11 mg/dL (ref 6–20)
Bilirubin Total: 0.7 mg/dL (ref 0.0–1.2)
CO2: 24 mmol/L (ref 20–29)
Calcium: 10.2 mg/dL (ref 8.7–10.2)
Chloride: 99 mmol/L (ref 96–106)
Creatinine, Ser: 1.01 mg/dL (ref 0.76–1.27)
GFR calc Af Amer: 112 mL/min/{1.73_m2} (ref >59)
GFR calc non Af Amer: 97 mL/min/{1.73_m2} (ref >59)
Globulin, Total: 2.9 g/dL (ref 1.5–4.5)
Glucose: 91 mg/dL (ref 65–99)
Potassium: 5 mmol/L (ref 3.5–5.2)
Sodium: 140 mmol/L (ref 134–144)
Total Protein: 7.8 g/dL (ref 6.0–8.5)

## 2019-06-19 LAB — CBC WITH DIFFERENTIAL/PLATELET
Basophils Absolute: 0 10*3/uL (ref 0.0–0.2)
Basos: 0 %
EOS (ABSOLUTE): 0.1 10*3/uL (ref 0.0–0.4)
Eos: 2 %
Hematocrit: 53.3 % — ABNORMAL HIGH (ref 37.5–51.0)
Hemoglobin: 17.5 g/dL (ref 13.0–17.7)
Immature Grans (Abs): 0 10*3/uL (ref 0.0–0.1)
Immature Granulocytes: 0 %
Lymphocytes Absolute: 1.5 10*3/uL (ref 0.7–3.1)
Lymphs: 28 %
MCH: 30.3 pg (ref 26.6–33.0)
MCHC: 32.8 g/dL (ref 31.5–35.7)
MCV: 92 fL (ref 79–97)
Monocytes Absolute: 0.4 10*3/uL (ref 0.1–0.9)
Monocytes: 8 %
Neutrophils Absolute: 3.4 10*3/uL (ref 1.4–7.0)
Neutrophils: 62 %
Platelets: 218 10*3/uL (ref 150–450)
RBC: 5.78 x10E6/uL (ref 4.14–5.80)
RDW: 12.6 % (ref 11.6–15.4)
WBC: 5.5 10*3/uL (ref 3.4–10.8)

## 2019-06-19 LAB — RPR: RPR Ser Ql: NONREACTIVE

## 2019-06-19 LAB — HIV ANTIBODY (ROUTINE TESTING W REFLEX): HIV Screen 4th Generation wRfx: NONREACTIVE

## 2019-06-22 LAB — GC/CHLAMYDIA PROBE AMP
Chlamydia trachomatis, NAA: NEGATIVE
Neisseria Gonorrhoeae by PCR: NEGATIVE

## 2019-06-25 ENCOUNTER — Ambulatory Visit
Admission: RE | Admit: 2019-06-25 | Discharge: 2019-06-25 | Disposition: A | Discharge status: Home / Self Care | Payer: Managed Care, Other (non HMO) | Source: Ambulatory Visit | Attending: Family Medicine | Admitting: Family Medicine

## 2019-06-25 DIAGNOSIS — N50811 Right testicular pain: Secondary | ICD-10-CM

## 2019-06-25 DIAGNOSIS — G8929 Other chronic pain: Secondary | ICD-10-CM

## 2019-06-25 IMAGING — CR LUMBAR SPINE - COMPLETE 4+ VIEW
5 series · 5 of 5 positions shown · non-contrast
Comparison: None.

CLINICAL DATA: Low back pain for several weeks.  No known injury.

EXAM:
LUMBAR SPINE - COMPLETE 4+ VIEW

[t l-spine a.p.]
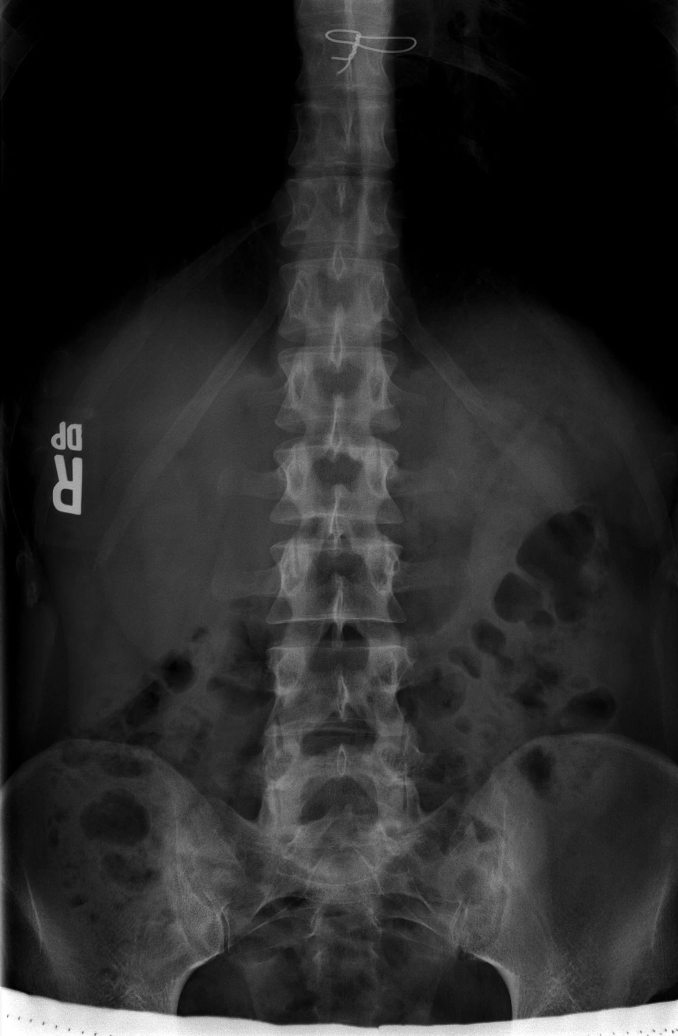

[t l-spine oblique exposure (1 of 2)]
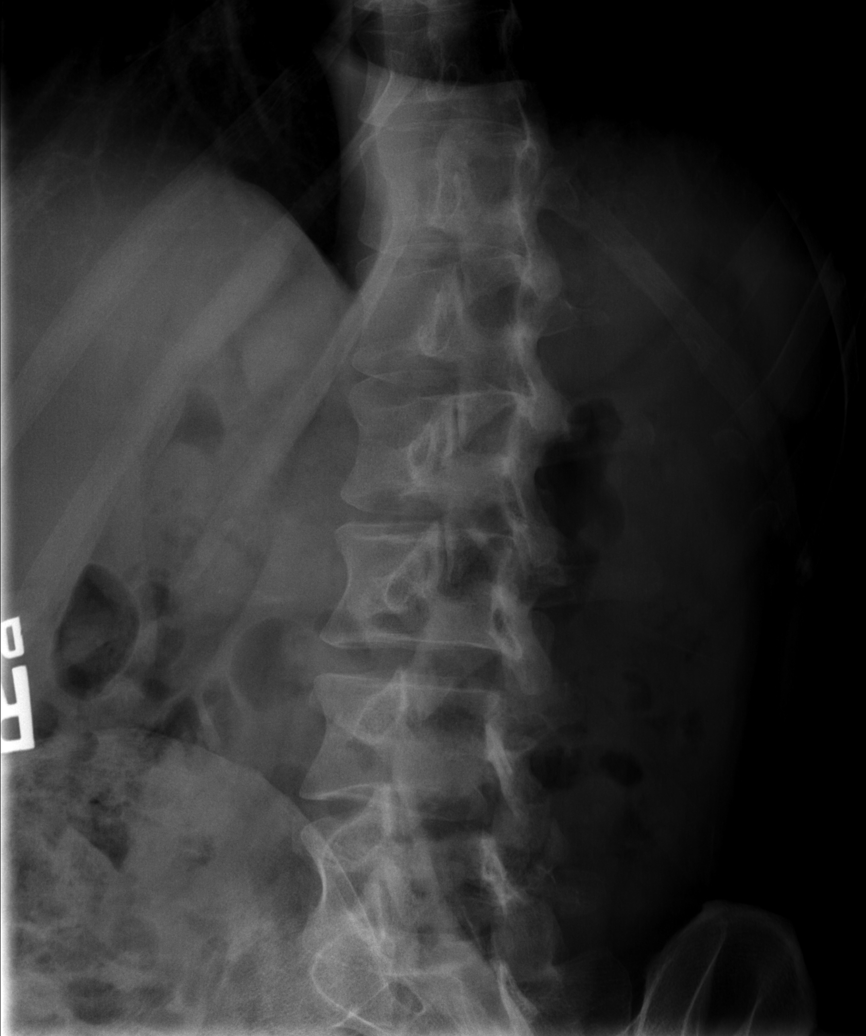

[t l-spine oblique exposure (2 of 2)]
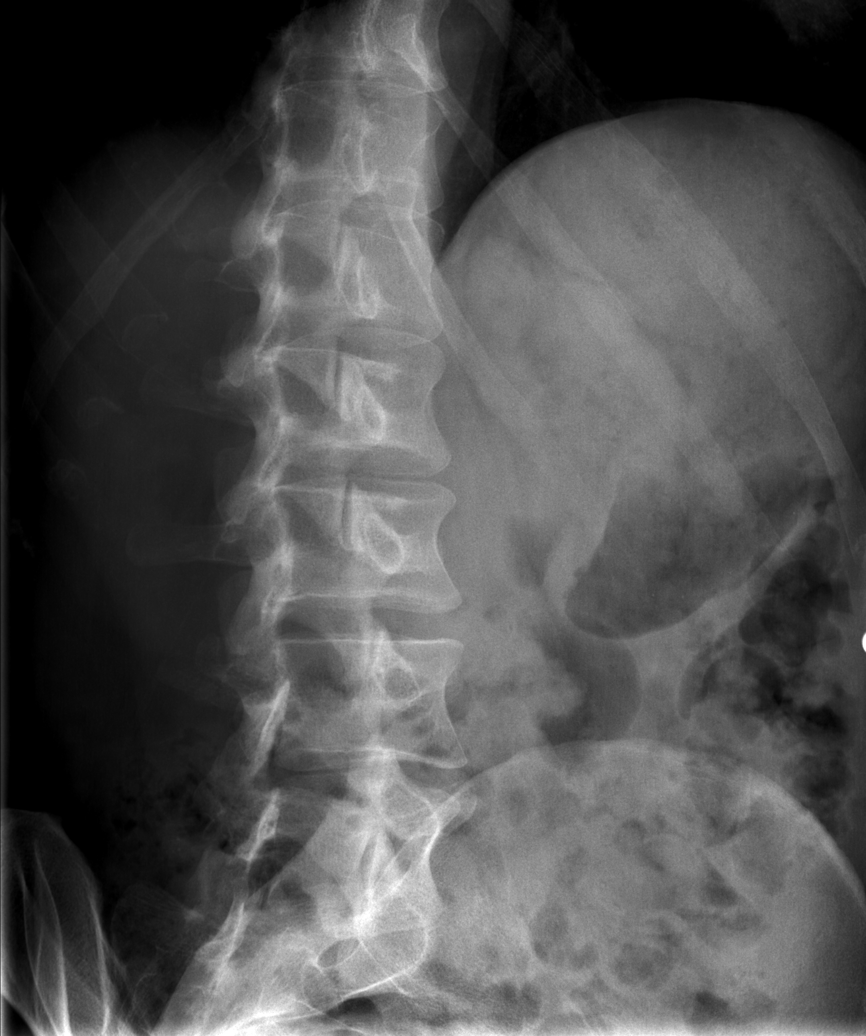

[t l-spine lat]
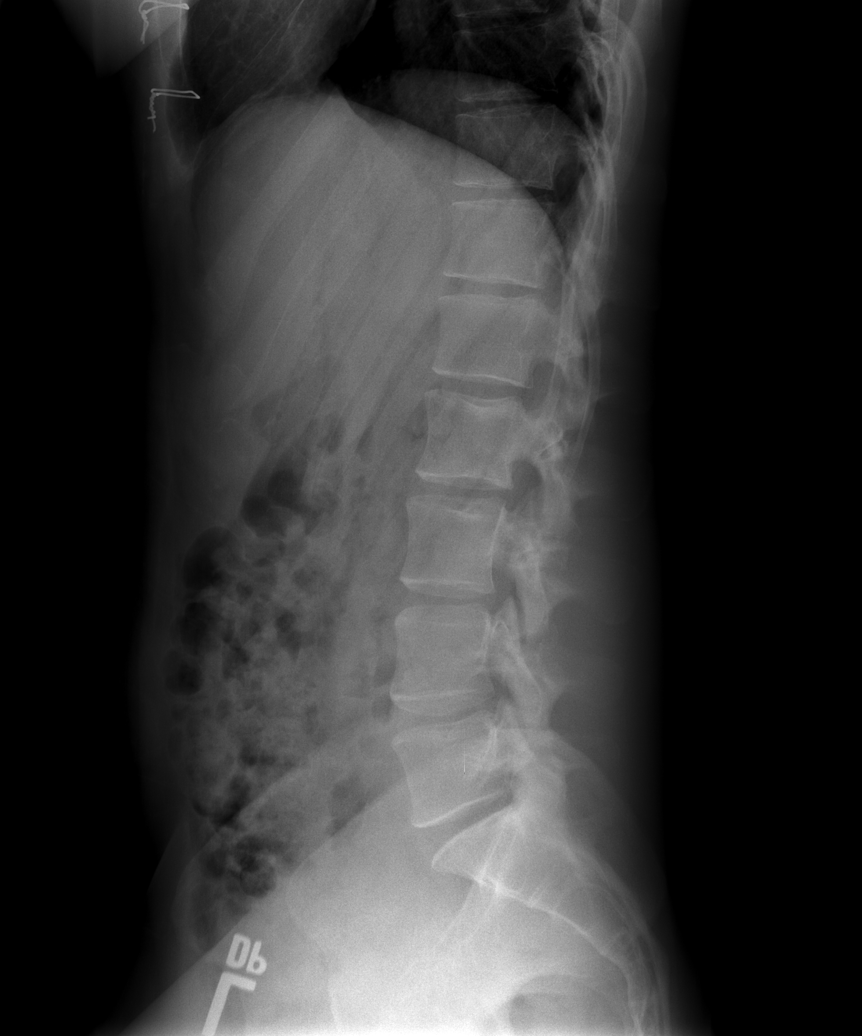

[t l-spine l5-s1 spot]
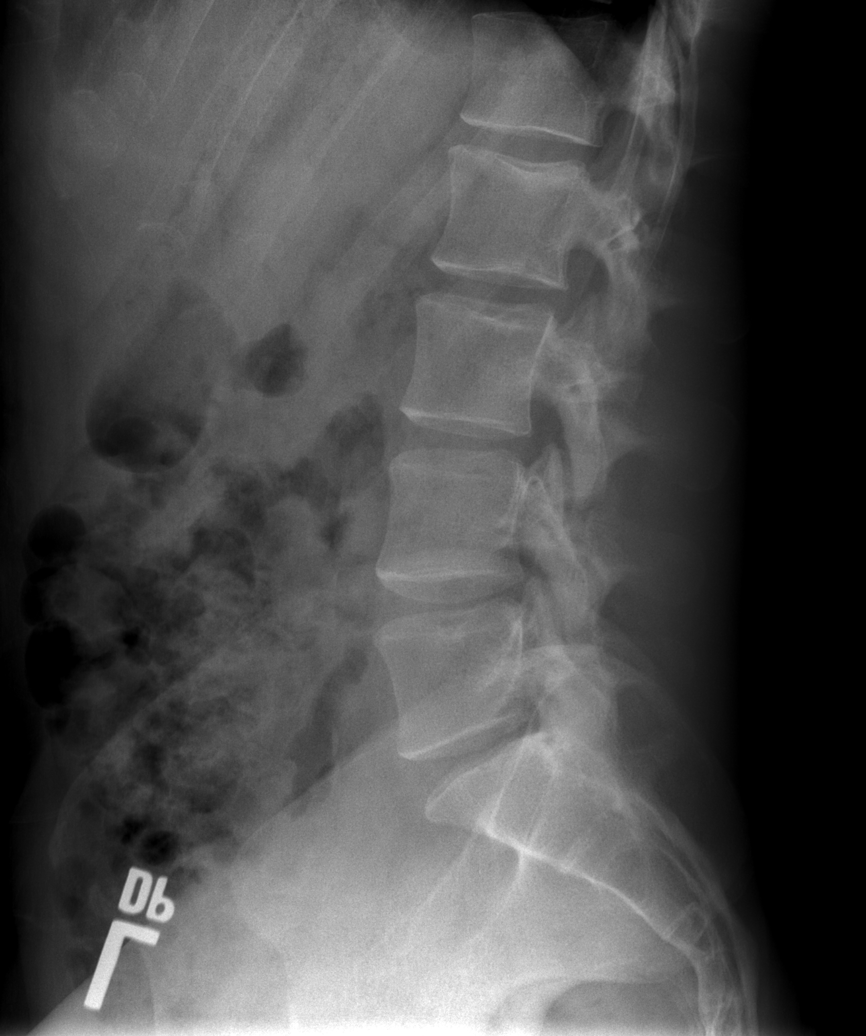

[5 of 5 positions shown; findings below may reference images not displayed]

FINDINGS: There are 5 non rib-bearing lumbar type vertebral bodies.

There is straightening of the expected lumbar lordosis. No
anterolisthesis or retrolisthesis. No definite pars defects.

Lumbar vertebral body heights appear preserved.

Mild multilevel lumbar spine DDD, likely worse at L1-L2 with disc
space height loss, endplate irregularity and sclerosis.

Limited visualization the bilateral SI joints is normal. Regional
bowel gas pattern and soft tissues are normal.

A sternotomy wire overlies the midline of the lower chest
IMPRESSION: 1. Mild straightening of the expected lumbar lordosis, nonspecific
though could be seen in the setting of muscle spasm. Otherwise, no
acute findings.
2. Mild multilevel lumbar spine DDD, worse at 1-2.

## 2019-06-25 IMAGING — US ULTRASOUND SCROTUM DOPPLER COMPLETE
1 series · 14 of 25 positions shown · non-contrast
Comparison: None.

CLINICAL DATA: Pain in the right testicle for 2 months. History of
mediastinal seminoma.

EXAM:
SCROTAL ULTRASOUND
DOPPLER ULTRASOUND OF THE TESTICLES
TECHNIQUE: Complete ultrasound examination of the testicles, epididymis, and
other scrotal structures was performed. Color and spectral Doppler
ultrasound were also utilized to evaluate blood flow to the
testicles.

[Series 1: ultrasound scrotum doppler complete · 0.06mm/px · 14 of 79 slices shown]
[im 1/79]
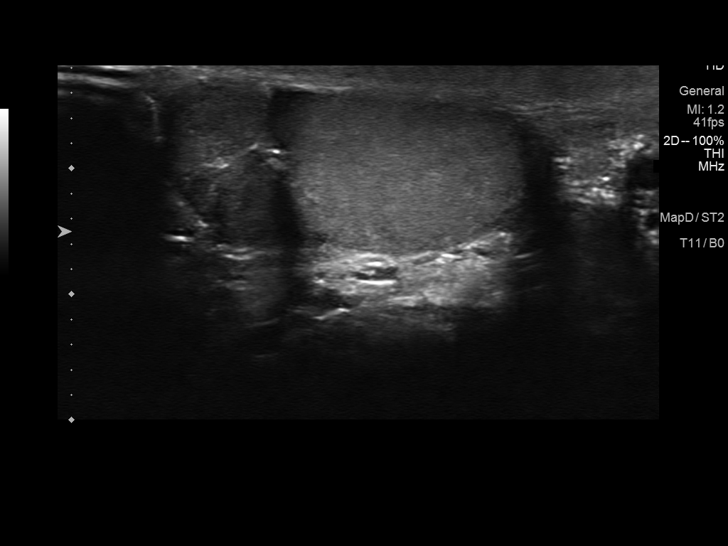
[im 7/79]
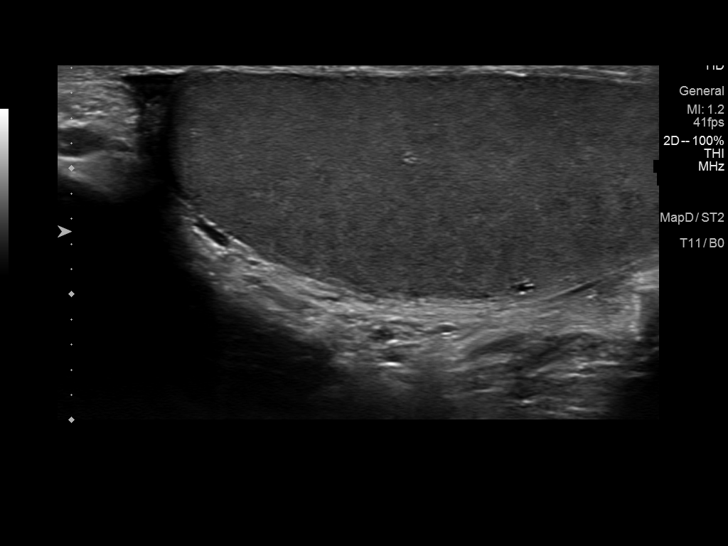
[im 14/79]
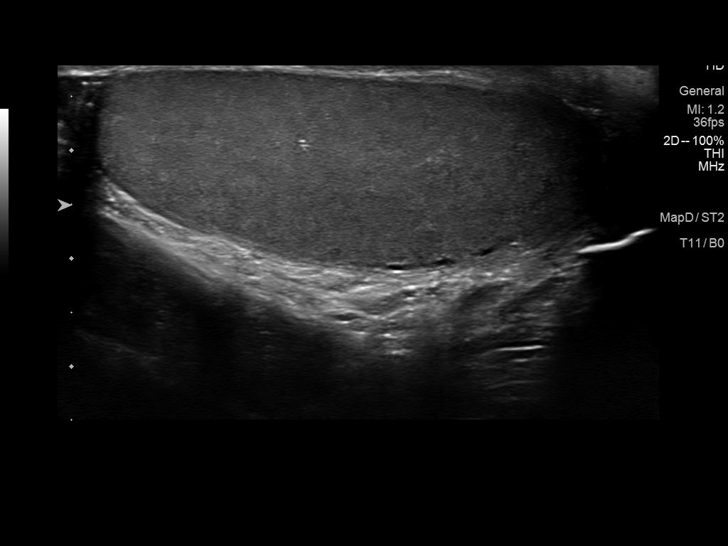
[im 20/79]
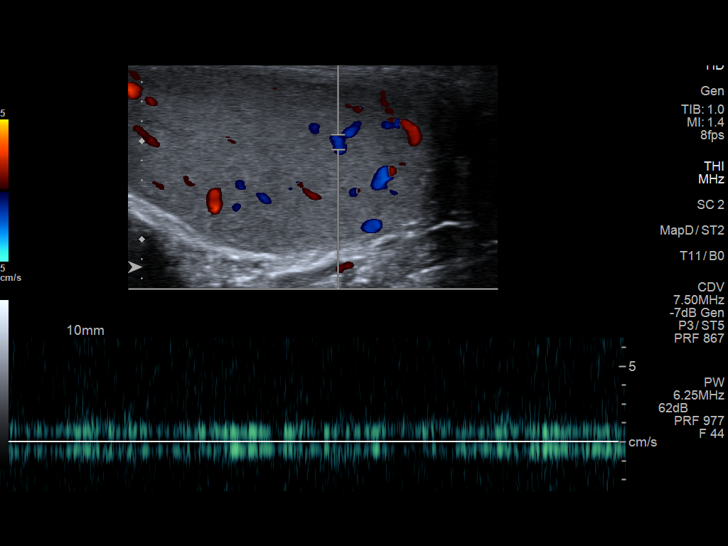
[im 27/79]
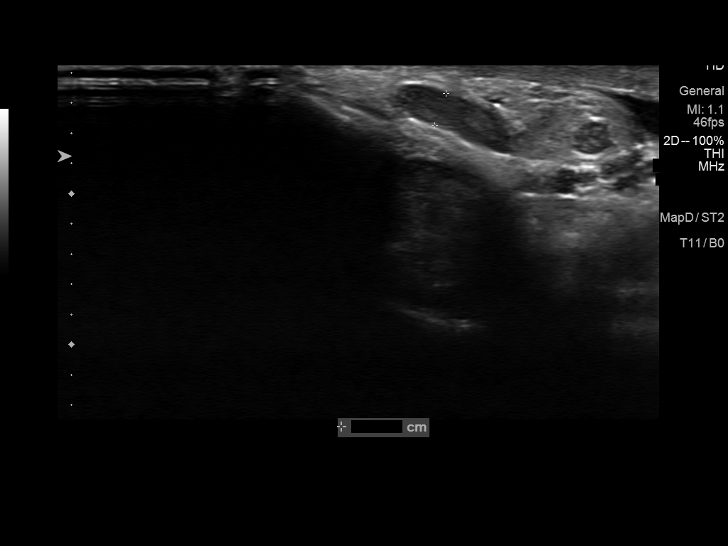
[im 30/79]
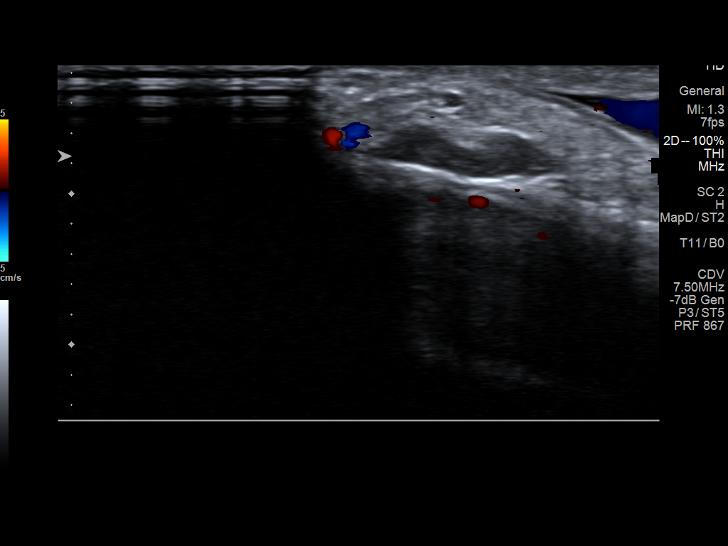
[im 36/79]
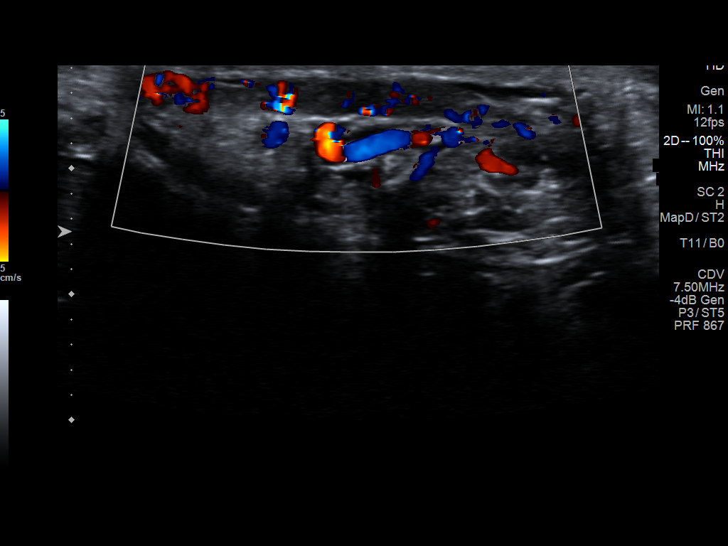
[im 43/79]
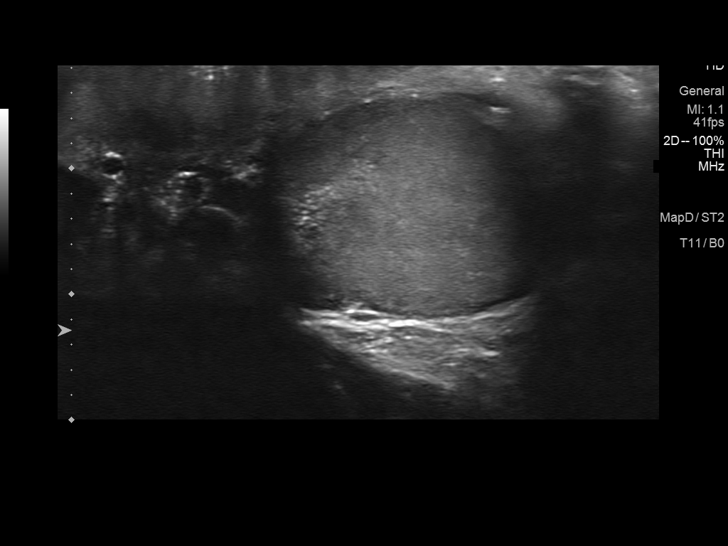
[im 49/79]
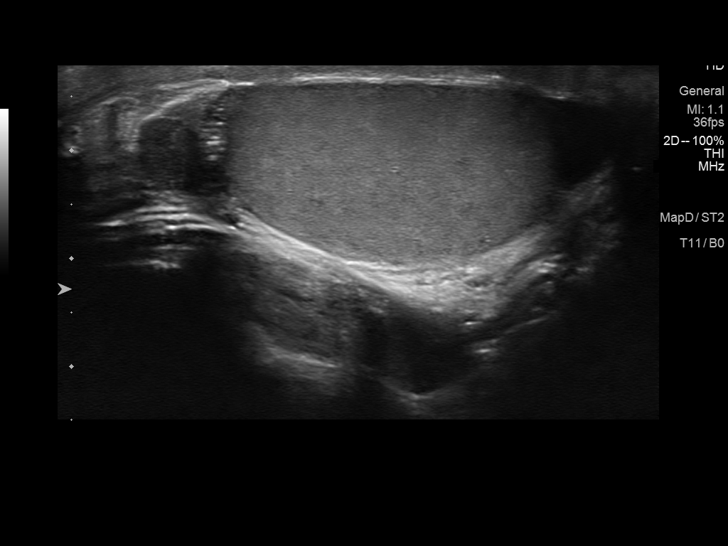
[im 53/79]
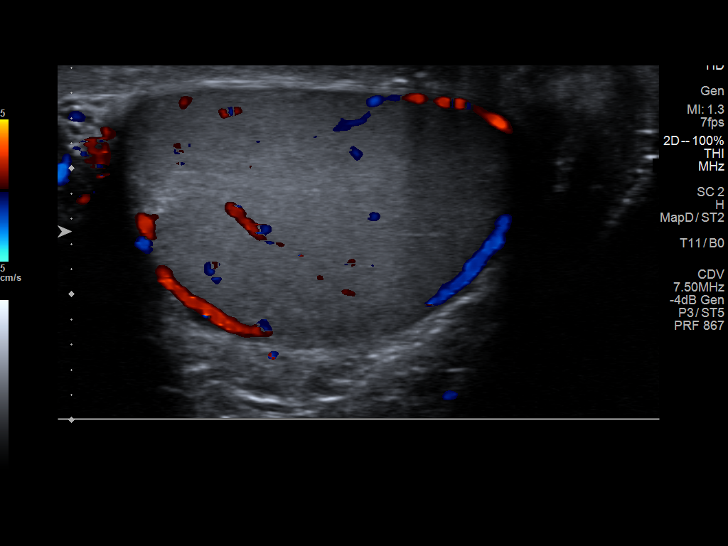
[im 59/79]
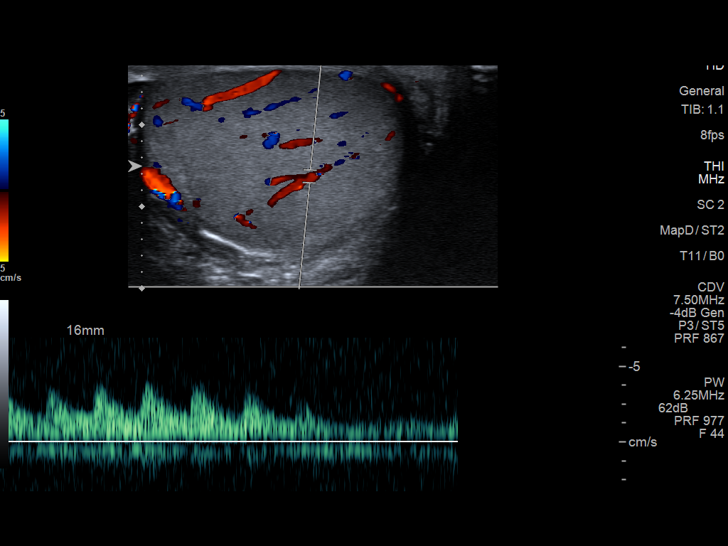
[im 66/79]
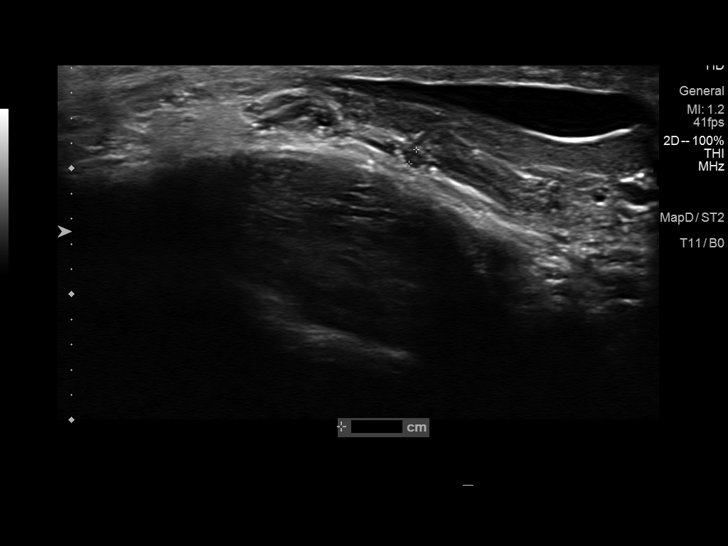
[im 72/79]
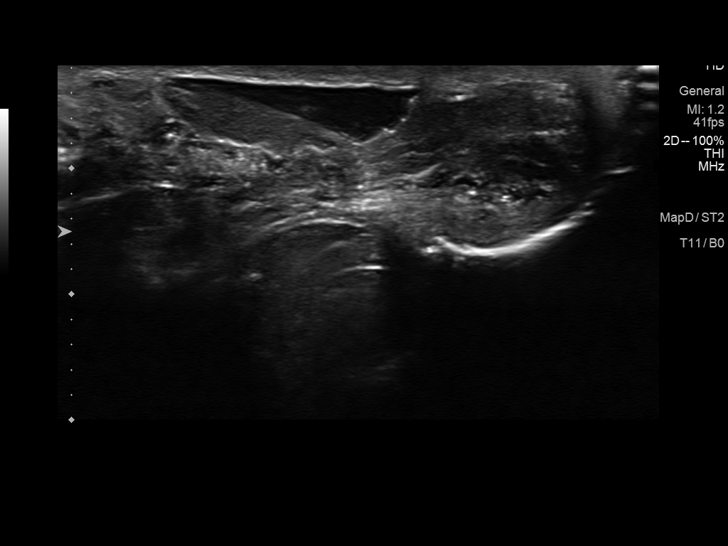
[im 79/79]
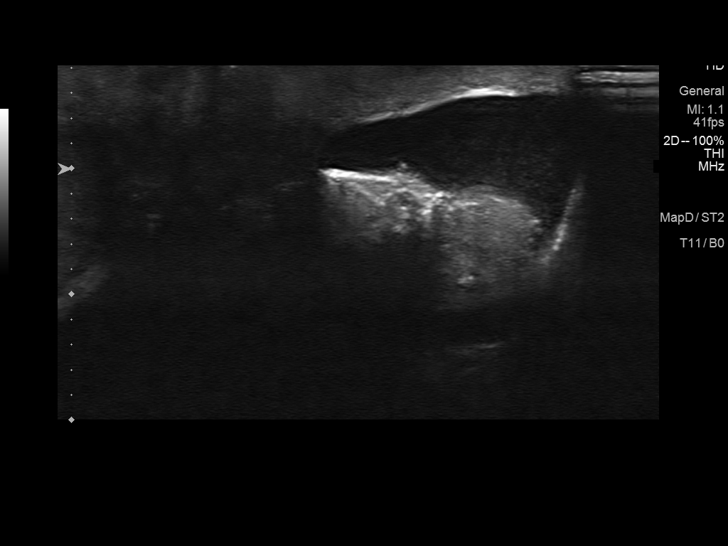

[14 of 25 positions shown; findings below may reference images not displayed]

FINDINGS: Right testicle

Measurements: 3.1 x 4.7 x 1.8 cm. No mass or microlithiasis
visualized.

Left testicle

Measurements: 3.2 x 3.9 x 2.0 cm. No mass or microlithiasis
visualized.

Right epididymis:  Normal in size and appearance.

Left epididymis:  Normal in size and appearance.

Hydrocele:  None visualized.

Varicocele:  None visualized.

Pulsed Doppler interrogation of both testes demonstrates normal low
resistance arterial and venous waveforms bilaterally.
IMPRESSION: Normal scrotal ultrasound.

## 2019-09-30 ENCOUNTER — Telehealth: Payer: Self-pay | Admitting: Family Medicine

## 2019-09-30 NOTE — Telephone Encounter (Signed)
Pt called and requested a refill on medication he was given for a appt in August.  Pt states that medication cleared up issue but he is now having same symptoms. Pt was informed since it had been so long he probably would need an appt. Pt declined stating he would like a message sent back. Pt was advised that provider is out of town and I was unsure when issue would be addressed. Pt can be reached at [phone] and uses [facility] on [address].

## 2019-10-04 NOTE — Telephone Encounter (Signed)
He will need an appt to discuss concerns and possibly an exam depending on symptoms.

## 2019-10-05 NOTE — Telephone Encounter (Signed)
Left message for pt to call back  °

## 2019-10-06 NOTE — Telephone Encounter (Signed)
Left message for pt to call back to schedule.

## 2019-10-15 ENCOUNTER — Encounter: Payer: Self-pay | Admitting: Family Medicine

## 2019-10-15 ENCOUNTER — Ambulatory Visit (INDEPENDENT_AMBULATORY_CARE_PROVIDER_SITE_OTHER): Payer: Managed Care, Other (non HMO) | Admitting: Family Medicine

## 2019-10-15 ENCOUNTER — Other Ambulatory Visit: Payer: Self-pay

## 2019-10-15 VITALS — BP 130/70 | HR 103 | Temp 97.8°F | Wt 145.5 lb

## 2019-10-15 DIAGNOSIS — M545 Low back pain, unspecified: Secondary | ICD-10-CM

## 2019-10-15 DIAGNOSIS — N50811 Right testicular pain: Secondary | ICD-10-CM | POA: Diagnosis not present

## 2019-10-15 LAB — POCT URINALYSIS DIP (PROADVANTAGE DEVICE)
Bilirubin, UA: NEGATIVE
Blood, UA: NEGATIVE
Glucose, UA: NEGATIVE mg/dL
Ketones, POC UA: NEGATIVE mg/dL
Leukocytes, UA: NEGATIVE
Nitrite, UA: NEGATIVE
Protein Ur, POC: NEGATIVE mg/dL
Specific Gravity, Urine: 1.01
Urobilinogen, Ur: NEGATIVE
pH, UA: 6.5 (ref 5.0–8.0)

## 2019-10-15 MED ORDER — IBUPROFEN 800 MG PO TABS: 800.0000 mg | ORAL_TABLET | Freq: Three times a day (TID) | ORAL | 0 refills | Status: DC | PRN

## 2019-10-15 NOTE — Progress Notes (Signed)
Subjective:    Patient ID: Alexander Rivera, male    DOB: 02/06/1986, 33 y.o.   MRN: [id number]  HPI Chief Complaint  Patient presents with  . lower back pain    lower back pain and pain in tesicles- had this back in august and was given med and it went away so canceled urology appt and now its back and worsen and going up abdominal pain   He is here with complaints of a 3-4 week history of gradually worsening pain in his right testicle and bilateral low back.   Pain is described as a tightness. Stretching does not relieve the pain.  Small amount of radiation to the top of his left buttock. He also reports intermittent pain in his right groin.  States he took a course of Bactrim that was called in by a friend from home who is a Tax adviser. States he did not get any better.  Taking ibuprofen 400 mg and occasionally Tylenol with it.  Using heat on his lower back. Switched to ice last night.   He has a history of similar pain in August 2020. States the pain resolved then with the an antibiotic so he canceled his urology appointment.   Denies fever, chills, fatigue, night sweats, weight loss, dizziness, chest pain, palpitations, shortness of breath, N/V/D, urinary symptoms, penile discharge.  No ED issues   Reviewed allergies, medications, past medical, surgical, family, and social history.   Review of Systems Pertinent positives and negatives in the history of present illness.     Objective:   Physical Exam Exam conducted with a chaperone present.  Constitutional:      General: He is not in acute distress.    Appearance: Normal appearance. He is not ill-appearing.  Cardiovascular:     Rate and Rhythm: Normal rate and regular rhythm.     Pulses: Normal pulses.  Pulmonary:     Effort: Pulmonary effort is normal.     Breath sounds: Normal breath sounds.  Abdominal:     General: Abdomen is flat.     Palpations: Abdomen is soft.     Tenderness: There is no abdominal tenderness.    Genitourinary:    Pubic Area: No rash.      Penis: Normal.      Testes: Normal. Cremasteric reflex is present.     Epididymis:     Right: Normal.     Left: Normal.     Prostate: Normal.     Rectum: Normal. Guaiac result negative.  Musculoskeletal:     Cervical back: Normal range of motion and neck supple.  Lymphadenopathy:     Lower Body: No right inguinal adenopathy. No left inguinal adenopathy.  Skin:    General: Skin is warm and dry.     Capillary Refill: Capillary refill takes less than 2 seconds.  Neurological:     Mental Status: He is alert and oriented to person, place, and time.     Cranial Nerves: Cranial nerves are intact.     Sensory: Sensation is intact.     Gait: Gait is intact.    BP 130/70   Pulse (!) 103   Temp 97.8 F (36.6 C)   Wt 145 lb 8 oz (66 kg)   BMI 22.79 kg/m       Assessment & Plan:  Pain in right testicle - Plan: POCT Urinalysis DIP (Proadvantage Device)  Acute bilateral low back pain without sciatica - Plan: POCT Urinalysis DIP (Proadvantage Device), ibuprofen (ADVIL) 800 MG  tablet  He is not in any acute distress. No red flag symptoms.  Symptoms appear very similar to August and at that time he had a negative [place] of testicle and negative lumbar X ray.  UA negative today.  Declines STD testing and there does not appear to be a need for this.  Discussed a course of Cipro but he would prefer to hold off on antibiotics and I think this is reasonable.  Increase NSAIDs to 800 mg ibuprofen three times per day for now.  He will call and schedule with a urologist right away. Referral from August should still be fine but he will let me know if not.

## 2021-05-25 ENCOUNTER — Other Ambulatory Visit: Payer: Self-pay

## 2021-05-25 ENCOUNTER — Encounter: Payer: Self-pay | Admitting: Family Medicine

## 2021-05-25 ENCOUNTER — Ambulatory Visit: Payer: Managed Care, Other (non HMO) | Admitting: Family Medicine

## 2021-05-25 VITALS — BP 110/70 | HR 84 | Ht 67.0 in | Wt 149.4 lb

## 2021-05-25 DIAGNOSIS — T3995XA Adverse effect of unspecified nonopioid analgesic, antipyretic and antirheumatic, initial encounter: Secondary | ICD-10-CM

## 2021-05-25 DIAGNOSIS — G4440 Drug-induced headache, not elsewhere classified, not intractable: Secondary | ICD-10-CM

## 2021-05-25 DIAGNOSIS — G43701 Chronic migraine without aura, not intractable, with status migrainosus: Secondary | ICD-10-CM

## 2021-05-25 MED ORDER — NURTEC 75 MG PO TBDP: 75.0000 mg | ORAL_TABLET | ORAL | 0 refills | Status: DC | PRN

## 2021-05-25 MED ORDER — NORTRIPTYLINE HCL 10 MG PO CAPS: ORAL_CAPSULE | ORAL | 0 refills | Status: DC

## 2021-05-25 MED ORDER — METHYLPREDNISOLONE 4 MG PO TBPK: ORAL_TABLET | ORAL | 0 refills | Status: DC

## 2021-05-25 NOTE — Patient Instructions (Signed)
I recommend seeing your eye doctor. Please keep a headache log/journal  Take the steroid course as directed.  In the next few days, start taking the Pamelor at bedtime. Take that every night at bedtime. If you aren't to groggy the next day, and if you're still having headaches, you can increase to 2 capsules at bedtime (20mg ).  I'm also giving you a prescription for Nurtec. This is only to be used when you actually have a full migraine.  No more Excedrin. Definitely no excedrin or advil, etc while you're on the steroids--tylenol only.  We may need to consider physical therapy if there really is more of a neck component to the pain, leading to the migraines. Continue to watch your posture and do the stretches at least 3x/day.  Chronic Migraine Headache A migraine is a type of headache that is usually stronger and more sudden than other headaches. Migraines are characterized by an intense pulsing, throbbing pain that is usually only present on one side of the head. Migraine painusually gets worse with activity. Migraines can cause nausea, vomiting, sensitivity to light and sound, and vision changes. Migraines that keep coming back are called recurrent migraines. A migraine is called a chronic migraine if it happens at least 15 days in a month for more than 3 months. Talk with your health care provider about what things may bring on (trigger) your migraines. What are the causes? The exact cause of this condition is not known. However, a migraine may be caused when nerves in the brain become irritated and release chemicals that cause inflammation of blood vessels. The inflammation of the blood vesselscauses pain. Migraines may be triggered or caused by: Smoking. Certain foods and drinks, such as: Aged cheese. Chocolate. Alcohol. Caffeine. Foods or drinks that contain nitrates, glutamate, aspartame, MSG, or tyramine. Medicines, such as birth control pills or some blood pressure  medicines. Other things that may trigger a migraine include: Menstruation. Emotional stress. Lack of sleep or too much sleep. Tiredness (fatigue). Bright lights or loud noises. Odors. Weather changes and high altitude. What increases the risk? The following factors may make you more likely to experience chronic migraine: Having migraines or a family history of migraines. Having a mental health condition, such as depression or anxiety. Having to take a lot of pain medicine. Having sleep problems. Having heart disease, diabetes, or obesity. What are the signs or symptoms? Symptoms of a migraine vary for each person and may include: Pulsating or throbbing pain. Pain that is usually only present on one side of the head. In some cases, the pain may be on both sides of the head or around the head or neck. Severe pain that prevents you from doing daily activities. Pain that gets worse with physical activity. Nausea, vomiting, or both. Pain with exposure to bright lights, loud noises, or activity. General sensitivity to bright lights, loud noises, or smells. Dizziness. A sign that a migraine is becoming chronic is an increasing number of migraine episodes. It is considered chronic if the migraine happens at least 15 days ina month for more than 3 months. How is this diagnosed? This condition is often diagnosed based on: Your symptoms and medical history. A physical exam. You may also have tests, including: A CT scan or an MRI of your brain. These imaging tests cannot diagnose migraines, but they can help to rule out other causes of headaches. Taking fluid from the spine (lumbar puncture) and analyzing it (cerebrospinal fluid analysis, or CSF analysis). Blood tests.  How is this treated? This condition is treated with: Medicines. These help to: Lessen pain and nausea. Prevent migraines. Lifestyle changes, such as changes to your diet or sleeping patterns. Behavior therapy. This may  include: Relaxation training. Biofeedback. This is a treatment that teaches you to relax and use your brain to lower your heart rate and control your breathing. Cognitive behavioral therapy (CBT). This is a form of talk therapy. This therapy helps you set goals and follow up on the changes that you make. Acupuncture. Using a device that provides electrical stimulation to your nerves, which can relieve pain (neuromodulation therapy). Surgery, if the other treatments are not working. Follow these instructions at home: Medicines Take over-the-counter and prescription medicines only as told by your health care provider. Ask your health care provider if the medicine prescribed to you requires you to avoid driving or using machinery. Lifestyle  Do not use any products that contain nicotine or tobacco, such as cigarettes, e-cigarettes, and chewing tobacco. If you need help quitting, ask your health care provider. Do not drink alcohol. Get 7-9 hours of sleep each night, or the amount of sleep recommended by your health care provider. Find ways to manage stress, such as meditation, deep breathing, or yoga. Maintain a healthy weight. If you need help losing weight, ask your health care provider. Exercise regularly. Aim for 150 minutes of moderate-intensity exercise, such as walking, biking, or yoga, or 75 minutes of vigorous exercise each week. Vigorous exercise includes running, circuit training, and swimming.  General instructions  Keep a journal to find out what triggers your migraines so you can avoid these triggers. For example, write down: What you eat and drink. How much sleep you get. Any change to your diet or medicines. Lie down in a dark, quiet room when you have a migraine. Try placing a cool towel over your head when you have a migraine. Keep lights dim, if bright lights bother you or make your migraines worse. Keep all follow-up visits as told by your health care provider. This is  important.  Where to find more information Coalition for Headache and Migraine Patients (CHAMP): headachemigraine.org American Migraine Foundation: americanmigrainefoundation.org National Headache Foundation: headaches.org Contact a health care provider if: Your pain does not improve, even with medicine. Your migraines continue to return, even with medicine. Get help right away if: Your migraine becomes severe and medicine does not help. You have a stiff neck and fever. You have a loss of vision. You have muscle weakness or loss of muscle control. You start losing your balance, or you have trouble walking. You feel like you may faint, or you faint. You start having sudden and unexpected, severe headaches. You have a seizure. Summary Migraine headaches are usually stronger and more sudden than other headaches. Migraines are characterized by an intense pulsing, throbbing pain that is usually only present on one side of the head. Migraines that keep coming back are called recurrent migraines. A migraine is called a chronic migraine if it happens 15 days in a month for more than 3 months. Certain things may trigger migraines, such as lack of sleep or too much sleep, smoking, certain foods, alcohol, stress, and certain medicines. Your treatment plan may include medicines, lifestyle changes, and behavior therapy. This information is not intended to replace advice given to you by your health care provider. Make sure you discuss any questions you have with your healthcare provider. Document Revised: 12/09/2019 Document Reviewed: 12/09/2019 Elsevier Patient Education  [place].

## 2021-05-25 NOTE — Progress Notes (Signed)
Chief Complaint  Patient presents with   Headache    Has been going on for several years. Saw eye doctor about a year or so ago and rx in one eye changed- eye doctor also recommended he see PCP.    Has had bad HA's where he can't get out of bed starting in college.  It used to happen once in a blue moon.  More frequently in the last 7-8 years (would wake up with it, lasted 1/2 day, 2-3x/month. Recently has had headaches more often and more severe. Lasted the entire day, occurred 3 times last week.  Excedrin migraine is no longer effective (used to be). He takes this very frequently.  He used to take it and it would resolve the headaches, but now takes it daily (in the morning, prior to even having headache), plus as needed when headache develops (sometimes needs 3-4 doses, when they are lasting all day). He reports taking excedrin every morning for the past year to prevent headaches. He hasn't used any other medication for his headaches. Going to bed helps resolve the headache, if he can fall asleep.  Pain starts at the base of the skull, and throughout the day will move to the R top of his skull, throbbing.  +photosensitivity (not much sound sensitivity). Also goes to the inner portion of his right eye (near bridge of nose). Some nausea, only rare vomiting, with most intense HA (hasn't vomited in years).  Denies aura  Used to always get the migraines only on Mondays. Works in lab--leaning forward, pipetting, and on computer.  No known triggers for his headaches. He drinks 1 cup of coffee daily. Occasional alcohol  Saw ophtho 1 year ago. Had a change in his prescription for his L eye last year. He reports that it helped initially some with headaches, but L eye feels worse than it was. Now he notes some double vision when not wearing glasses, unless he covers 1 eye, reports the L eye feels weak since this change.   PMH, PSH, SH reviewed  Outpatient Encounter Medications as of 05/25/2021   Medication Sig Note   aspirin-acetaminophen-caffeine (EXCEDRIN MIGRAINE) 250-250-65 MG tablet Take 2 tablets by mouth every 6 (six) hours as needed for headache.    Melatonin 5 MG CAPS Take by mouth at bedtime. 10/15/2016: Received from: [place]: Take 1 capsule by mouth nightly as needed.   [DISCONTINUED] ibuprofen (ADVIL) 800 MG tablet Take 1 tablet (800 mg total) by mouth every 8 (eight) hours as needed.    diphenhydrAMINE HCl (BENADRYL ALLERGY PO) Take by mouth. (Patient not taking: Reported on 05/25/2021)    No facility-administered encounter medications on file as of 05/25/2021.   No Known Allergies  ROS: no fever, chills, chest pain, palpitations, shortness of breath, numbness, tingling, trouble with memory, cognition. Some diplopia per HPI (when glasses off) No weight loss, fevers, night sweats.  Occasional runny nose (when in lab, chemical exposure), no sore throat, throat-clearing or cough. No sinus pain except the described HA above (going to the inner portion of the R eye.) Some shooting sensations in both legs when he lays down. Getting up and stretching helps.   PHYSICAL EXAM:  BP 110/70   Pulse 84   Ht '5\' 7"'$  (1.702 m)   Wt 149 lb 6.4 oz (67.8 kg)   BMI 23.40 kg/m   Wt Readings from Last 3 Encounters:  05/25/21 149 lb 6.4 oz (67.8 kg)  10/15/19 145 lb 8 oz (66  kg)  06/18/19 144 lb 3.2 oz (65.4 kg)   Pleasant, well-appearing male, in no distress HEENT: conjunctiva and sclera are clear, EOMI. Fundi benign, though the L wasn't well visualized.  Nontender at temporal arteries and temporalis muscles Nasal mucosa with mild edema and crusting R>L  Sinuses nontender. OP is clear Neck: no lymphadenopathy, thyromegaly or carotid bruit Heart: regular rate and rhythm, no murmur Lungs: clear bilaterally Back: no spinal or CVA tenderness Abdomen: soft, nontender, no mass Extremities: no edema, normal pulses Neuro: alert and oriented. Cranial nerves  intact. Normal strength, sensation, finger to nose, negative Romberg, normal tandem gait. DTR's 2+ and symmetric Psych: normal mood, affect, hygiene and grooming   ASSESSMENT/PLAN:  Chronic migraine without aura with status migrainosus, not intractable - steroid course, risks/SE reviewed. pamelor for prevention, nurtec prn. Stop excedrin--contributing to chronic HAs - Plan: methylPREDNISolone (MEDROL DOSEPAK) 4 MG TBPK tablet, nortriptyline (PAMELOR) 10 MG capsule, Rimegepant Sulfate (NURTEC) 75 MG TBDP  Analgesic rebound headache - course of steroids, stop excedrin. Trial pamelor   I spent 56 minutes dedicated to the care of this patient, including pre-visit review of records, face to face time, post-visit ordering of testing and documentation.  I recommend seeing your eye doctor. Please keep a headache log/journal  Take the steroid course as directed.  In the next few days, start taking the Pamelor at bedtime. Take that every night at bedtime. If you aren't to groggy the next day, and if you're still having headaches, you can increase to 2 capsules at bedtime ('20mg'$ ).  I'm also giving you a prescription for Nurtec. This is only to be used when you actually have a full migraine.  No more Excedrin. Definitely no excedrin or advil, etc while you're on the steroids--tylenol only.  We may need to consider physical therapy if there really is more of a neck component to the pain, leading to the migraines. Continue to watch your posture and do the stretches at least 3x/day.

## 2021-06-04 ENCOUNTER — Telehealth: Payer: Self-pay

## 2021-06-04 NOTE — Telephone Encounter (Signed)
P.A. NURTEC  

## 2021-06-05 NOTE — Telephone Encounter (Signed)
P.A. Nurtec denied, pt needs trial of 2 triptans, do you want to switch ?

## 2021-06-05 NOTE — Telephone Encounter (Signed)
Confirm that he has never tried any triptans for migraines (ie imitrex or others). If not, rx naratriptan 2.'5mg'$ .  Take at onset of migraine. May repeat once after 4 hours if has persistent headache. (Max 2 tablets in 24 hrs)  #10, no refill

## 2021-06-06 NOTE — Telephone Encounter (Signed)
Left message for pt to call me back 

## 2021-06-06 NOTE — Telephone Encounter (Signed)
Pt states he picked up the nurtec at no charge and took one on Sunday.

## 2021-06-17 NOTE — Telephone Encounter (Signed)
done

## 2021-06-21 NOTE — Progress Notes (Signed)
Chief Complaint  Patient presents with   other    Med check f/u on migraines medication to take after migraine helped a lot but the preventive migraine med doesn't seem to be working.    Patient presents for 4 week follow-up on headaches. At his visit last month he was given a Medrol dosepak--was having chronic migraines, felt to have analgesic rebound HA component.  He was advised to stop use of Excedrin. He was started on Pamelor '10mg'$  (to start at bedtime, and increase to '20mg'$  if not causing grogginess), and given Nurtec to use for acute headaches.  He was encouraged to see his eye doctor and keep a headache journal. We had discussed the possibility of PT if neck component of pain is triggering migraines. Home exercises/stretches were recommended.  Felt great while on the steroids, no tension, no headaches. Just a little more tired. He has eye exam scheduled. He has been keeping HA journal, forgot to bring to visit.  Medication helps him sleep, but no change in frequency of the migraines. Felt a little groggy only during the first week, no change when he doubled the dose (but also was taking steroids, and stopped Excedrin).  He works 12 hour shifts on the weekends, and finds that he gets headaches starting on the last day of work/coming off of work, usually on Sunday or Monday. This past week he had one on Tuesday, which was an aberration. Felt mild tension on Monday, prior to Tuesday's migraine.  Nurtec has been working well (4 of the 5x)--worked within an hour, completely resolved headache.  Didn't work when he woke up on Tuesday with migraine--took it right away, didn't help  Having 1 migraine/week, one week had 2  Has been doing stretches for his neck as we discussed. Pain/discomfort always starts from neck, up the base of his skull. When it develops into a full migraine, is at the top of head and to his eyes.  Tried TENS unitin the past without benefit. Saw chiro 2 years  ago. He is aware that his posture/position at work is likely contributing, tried to modify, hasn't helped.  Nurtec was denied by his insurance, but was able to get it with the savings card.  Worried he won't be able to get refills.  He recalls seeing something about only being able to get certain meds if under the care of neurologist. Asking about whether any imaging is needed.  PMH, PSH, SH reviewed  Outpatient Encounter Medications as of 06/22/2021  Medication Sig Note   naratriptan (AMERGE) 2.5 MG tablet Take 1 tablet (2.5 mg total) by mouth as needed for migraine. Take one (1) tablet at onset of headache; if returns or does not resolve, may repeat after 4 hours; do not exceed five (5) mg in 24 hours.    nortriptyline (PAMELOR) 50 MG capsule Take 1 capsule (50 mg total) by mouth at bedtime.    Rimegepant Sulfate (NURTEC) 75 MG TBDP Take 75 mg by mouth every other day as needed (migraine). 06/22/2021: prn   [DISCONTINUED] nortriptyline (PAMELOR) 10 MG capsule Take 1 capsule at bedtime. You may increase to 2 capsules after 1 week if tolerated 06/22/2021: Taking 2 capsules.    aspirin-acetaminophen-caffeine (EXCEDRIN MIGRAINE) 250-250-65 MG tablet Take 2 tablets by mouth every 6 (six) hours as needed for headache. (Patient not taking: Reported on 06/22/2021) 06/22/2021: Prn has not taken since last apt.    diphenhydrAMINE HCl (BENADRYL ALLERGY PO) Take by mouth. (Patient not taking: No sig reported)  Melatonin 5 MG CAPS Take by mouth at bedtime. (Patient not taking: Reported on 06/22/2021) 10/15/2016: Received from: [place]: Take 1 capsule by mouth nightly as needed.   methylPREDNISolone (MEDROL DOSEPAK) 4 MG TBPK tablet Take as directed (Patient not taking: Reported on 06/22/2021)    No facility-administered encounter medications on file as of 06/22/2021.   NOT taking '50mg'$  pamelor dose or naratriptan prior to today's visit  No Known Allergies   ROS: Denies fever, chills, URI  symptoms, dizziness, shortness of breath, chest pain.  Denies nausea, vomiting, bowel changes, urinary complaints, bleeding, bruising, rash. Headaches per HPI. No numbness, tingling or other focal neurologic symptoms.    PHYSICAL EXAM:  BP 122/74   Pulse (!) 109   Temp 98.6 F (37 C)   Wt 155 lb 3.2 oz (70.4 kg)   BMI 24.31 kg/m   Wt Readings from Last 3 Encounters:  06/22/21 155 lb 3.2 oz (70.4 kg)  05/25/21 149 lb 6.4 oz (67.8 kg)  10/15/19 145 lb 8 oz (66 kg)   Well-appearing male, in no distress HEENT: conjunctiva and sclera are clear, EOMI, wearing mask Neck: no lymphadenopathy or mass Heart: mildly tachycardic, rate 100, no murmur Lungs: clear bilaterally Abdomen: soft, nontender Extremities: no edema Psych: normal mood, affect, hygiene and grooming Neuro: alert and oriented, normal strength, gait.  ASSESSMENT/PLAN:  Chronic migraine without aura with status migrainosus, not intractable - Now off daily Excedrin. Nurtec effective, need better prevention--trial higher dose pamelor. topamax and botox also discussed. refer to neuro as well - Plan: naratriptan (AMERGE) 2.5 MG tablet, nortriptyline (PAMELOR) 50 MG capsule, Ambulatory referral to Neurology  Chronic tension-type headache, not intractable - having posterior headaches, which trigger migraines. Discussed PT/chiro, increasing pamelor, poss Botox  You may try 2 Aleve or 600-800 mg of ibuprofen at onset of posterior headache/neck pain (on Sundays/Mondays only, not to be used daily). Perhaps taking this early on, prior to full migraine, may help prevent the migraine. Be sure to take it with food, and stop if it is bothering your stomach. If migraine develops, go ahead and take either the triptan or the nurtec.  We are prescribing naratriptan for migraines. You can use this first when you develop a migraine. You may repeat it once in 4 hours if you have persistent headache (max is 2 pills/24 hours). If this isn't as  effective as the Nurtec, you can use Nurtec first, and use this only if the Nurtec didn't work (like occurred this past Tuesday).  Other options for Triptans would include imitrex nasal spray vs tablets (your preference--spray is preferred if having nausea/vomiting). We may need to try two of them in order to get the Nurtec approved.    Options for preventative treatments include titrating up the pamelor to '50mg'$ . If you don't tolerate this (too groggy the next morning or other issues), then we can switch to topamax as a preventative. This is started '25mg'$  at bedtime, then increased to '50mg'$  at bedtime, and ultimately increased to '50mg'$  twice daily.  Often times people may feel groggy during the day.  We only need to go up to this full dose if not getting benefit at the lower doses (at bedtime only).  Other options for preventative treatment include monthly injections such as Aimovig, Ajovy. Not sure what your insurance will say about using these.  We will refer you to neurologist, given that your insurance states certain medications are more likely to be covered if prescribed by a  neurologist rather than your PCP.  Send message within a couple of weeks with how you're dealing with the new medications, and if we need to change.

## 2021-06-22 ENCOUNTER — Ambulatory Visit: Payer: Managed Care, Other (non HMO) | Admitting: Family Medicine

## 2021-06-22 ENCOUNTER — Other Ambulatory Visit: Payer: Self-pay

## 2021-06-22 VITALS — BP 122/74 | HR 109 | Temp 98.6°F | Wt 155.2 lb

## 2021-06-22 DIAGNOSIS — G43701 Chronic migraine without aura, not intractable, with status migrainosus: Secondary | ICD-10-CM

## 2021-06-22 DIAGNOSIS — G44229 Chronic tension-type headache, not intractable: Secondary | ICD-10-CM

## 2021-06-22 MED ORDER — NARATRIPTAN HCL 2.5 MG PO TABS: 2.5000 mg | ORAL_TABLET | ORAL | 0 refills | Status: DC | PRN

## 2021-06-22 MED ORDER — NORTRIPTYLINE HCL 50 MG PO CAPS: 50.0000 mg | ORAL_CAPSULE | Freq: Every day | ORAL | 2 refills | Status: DC

## 2021-06-22 NOTE — Patient Instructions (Addendum)
You may try 2 Aleve or 600-800 mg of ibuprofen at onset of posterior headache/neck pain (on Sundays/Mondays only, not to be used daily). Perhaps taking this early on, prior to full migraine, may help prevent the migraine. Be sure to take it with food, and stop if it is bothering your stomach. If migraine develops, go ahead and take either the triptan or the nurtec.  We are prescribing naratriptan for migraines. You can use this first when you develop a migraine. You may repeat it once in 4 hours if you have persistent headache (max is 2 pills/24 hours). If this isn't as effective as the Nurtec, you can use Nurtec first, and use this only if the Nurtec didn't work (like occurred this past Tuesday).  Other options for Triptans would include imitrex nasal spray vs tablets (your preference--spray is preferred if having nausea/vomiting). We may need to try two of them in order to get the Nurtec approved.    Options for preventative treatments include titrating up the pamelor to '50mg'$ . If you don't tolerate this (too groggy the next morning or other issues), then we can switch to topamax as a preventative. This is started '25mg'$  at bedtime, then increased to '50mg'$  at bedtime, and ultimately increased to '50mg'$  twice daily.  Often times people may feel groggy during the day.  We only need to go up to this full dose if not getting benefit at the lower doses (at bedtime only).   Other options for preventative treatment include monthly injections such as Aimovig, Ajovy. Not sure what your insurance will say about using these.  We will refer you to neurologist, given that your insurance states certain medications are more likely to be covered if prescribed by a neurologist rather than your PCP.  Send message within a couple of weeks with how you're dealing with the new medications, and if we need to change.   Consider chiropractor (we discussed [facility] on [address] and [facility] on [address]).  [place] ([place])

## 2021-06-23 ENCOUNTER — Encounter: Payer: Self-pay | Admitting: Neurology

## 2021-06-23 ENCOUNTER — Encounter: Payer: Self-pay | Admitting: Family Medicine

## 2021-06-23 MED ORDER — TOPIRAMATE 25 MG PO TABS: ORAL_TABLET | ORAL | 1 refills | Status: DC

## 2021-08-03 ENCOUNTER — Ambulatory Visit: Payer: Managed Care, Other (non HMO) | Admitting: Family Medicine

## 2021-08-09 NOTE — Progress Notes (Deleted)
   No chief complaint on file.  Patient presents for 6 week follow-up on headaches.  He is scheduled to see Dr. Tomi Likens on 09/13/21. After his last visit, he was started on topamax.  He preferred to change to topamax over titrating up the pamelor. He reported the pamelor helping him sleep, but no change in frequency of headaches. His pattern of headaches was that he gets them on the last day of work/coming off working 12 hour shifts on the weekends (usually on Sunday or Monday).  He is aware that his posture/position at work is likely contributing, tried to modify, hasn't helped. (Prev also tried TENS unit, saw chiro).  Upon initial evaluation, he was felt to have chronic migraines, analgesic rebound component, as well as possible tension component. He was treated with steroids, and headaches had resolved during that time.  He has Nurtec to use prn migraine. He was also prescribed Naratriptan (in case insurance prevented him from being able to continue to get Nurtec). UPDATE  Has been doing stretches for his neck as we discussed. Pain/discomfort always starts from neck, up the base of his skull. When it develops into a full migraine, is at the top of head and to his eyes.    PMH, PSH, SH reviewed   ROS: Denies fever, chills, URI symptoms, dizziness, shortness of breath, chest pain.  Denies nausea, vomiting, bowel changes, urinary complaints, bleeding, bruising, rash. Headaches per HPI. No numbness, tingling or other focal neurologic symptoms.    PHYSICAL EXAM:  There were no vitals taken for this visit.  Wt Readings from Last 3 Encounters:  06/22/21 155 lb 3.2 oz (70.4 kg)  05/25/21 149 lb 6.4 oz (67.8 kg)  10/15/19 145 lb 8 oz (66 kg)   Well-appearing male, in no distress HEENT: conjunctiva and sclera are clear, EOMI, wearing mask Neck: no lymphadenopathy or mass Heart: mildly tachycardic, rate 100, no murmur Lungs: clear bilaterally Abdomen: soft, nontender Extremities: no  edema Psych: normal mood, affect, hygiene and grooming Neuro: alert and oriented, normal strength, gait.  UPDATE HEART  ASSESSMENT/PLAN:   Flu shot We have no record of any COVID vaccines. If he has had them, and wants bivalent booster, can give (and need dates)

## 2021-08-10 ENCOUNTER — Ambulatory Visit: Payer: Managed Care, Other (non HMO) | Admitting: Family Medicine

## 2021-08-16 NOTE — Progress Notes (Signed)
Chief Complaint  Patient presents with   Follow-up    6 week follow up. Is on a course of predisone, last day is today and would like to wait a few more days to do flu and covid vaccines.    3 weeks ago woke up with kink in his R neck, lasted for 2 weeks. Saw ortho, who prescribed methylprednisone (finished pack today) and baclofen, and doing home neck exercises.  It is slowly getting better, has better range of motion (but still catches some).  Patient presents for follow-up on headaches.  He is scheduled to see Dr. Tomi Likens on 09/13/21. After his last visit, he was started on topamax.  He preferred to change to topamax over titrating up the pamelor. He reported the 20mg  doe of pamelor helped him sleep, but no change in frequency of headaches.  He tried topamax for 2 weeks--he had constant tension headaches, waking up with them.  He stopped the topamax, restarted the pamelor at the higher 50mg  dose, and hasn't had any further headaches. He had sedation the first few days, but has resolved.  Some grogginess only when he first wakes up. He is sleeping well.  His pattern of headaches had been to get them on the last day of work/coming off working 12 hour shifts on the weekends (usually on Sunday or Monday).  He is aware that his posture/position at work is likely contributing, and tries to be mindful of this (Prev also tried TENS unit, saw chiro).   Upon initial evaluation, he was felt to have chronic migraines, analgesic rebound component, as well as possible tension component. He was treated with steroids, and headaches had resolved during that time.   He has Nurtec to use prn migraine. He has 2-3 tablets left.  He was prescribed Naratriptan (in case insurance prevented him from being able to continue to get Nurtec). He did take this for a headache prior to restarting the pamelor, and recalls that it was not effective.   Has been doing stretches for his neck as we discussed. And also doing  exercises/stretches from the orthopedist      PMH, [place], [place] reviewed   Outpatient Encounter Medications as of 08/17/2021  Medication Sig Note   baclofen (LIORESAL) 20 MG tablet Take 20 mg by mouth 3 (three) times daily. 08/17/2021: Taking 1qhs   methylPREDNISolone (MEDROL DOSEPAK) 4 MG TBPK tablet Take as directed 08/17/2021: Last dose this morning   nortriptyline (PAMELOR) 50 MG capsule Take 1 capsule (50 mg total) by mouth at bedtime.    aspirin-acetaminophen-caffeine (EXCEDRIN MIGRAINE) 250-250-65 MG tablet Take 2 tablets by mouth every 6 (six) hours as needed for headache. (Patient not taking: No sig reported) 06/22/2021: Prn has not taken since last apt.    naratriptan (AMERGE) 2.5 MG tablet Take 1 tablet (2.5 mg total) by mouth as needed for migraine. Take one (1) tablet at onset of headache; if returns or does not resolve, may repeat after 4 hours; do not exceed five (5) mg in 24 hours. (Patient not taking: Reported on 08/17/2021)    Rimegepant Sulfate (NURTEC) 75 MG TBDP Take 75 mg by mouth every other day as needed (migraine). (Patient not taking: Reported on 08/17/2021) 06/22/2021: prn   [DISCONTINUED] diphenhydrAMINE HCl (BENADRYL ALLERGY PO) Take by mouth. (Patient not taking: No sig reported)    [DISCONTINUED] Melatonin 5 MG CAPS Take by mouth at bedtime. (Patient not taking: Reported on 06/22/2021) 10/15/2016: Received from: [place]: Take 1 capsule by  mouth nightly as needed.   [DISCONTINUED] topiramate (TOPAMAX) 25 MG tablet Start at 25 mg at bedtime, after a week increase to 2 pills at bedtime. After another week, start 1 pill in morning and continue 2 at bedtime, and after another week increase to 50 mg twice daily. (Patient not taking: Reported on 08/17/2021)    No facility-administered encounter medications on file as of 08/17/2021.    ROS: Denies fever, chills, URI symptoms, dizziness, shortness of breath, chest pain.  Denies nausea, vomiting, bowel changes,  urinary complaints, bleeding, bruising, rash. Denies depression.   Headaches resolved.  Neck pain per HPI.  No numbness, tingling or other focal neurologic symptoms.       PHYSICAL EXAM:   BP 128/84   Pulse 84   Ht 5\' 7"  (1.702 m)   Wt 157 lb 12.8 oz (71.6 kg)   BMI 24.71 kg/m   Wt Readings from Last 3 Encounters:  08/17/21 157 lb 12.8 oz (71.6 kg)  06/22/21 155 lb 3.2 oz (70.4 kg)  05/25/21 149 lb 6.4 oz (67.8 kg)    Well-appearing male, in no distress HEENT: conjunctiva and sclera are clear, EOMI, wearing mask Neck: no lymphadenopathy or mass. No spinal tenderness Heart: regular rate and rhythm, no murmur Lungs: clear bilaterally Abdomen: soft, nontender Back: no spinal or CVA tenderness Extremities: no edema Psych: normal mood, affect, hygiene and grooming Neuro: alert and oriented, normal strength, gait. DTR's 2+ and symmetric.     ASSESSMENT/PLAN:   Chronic tension-type headache, not intractable - improved/resolved with 50mg  nortriptyline, continue.  F/u with Dr. Tomi Likens as scheduled, can discuss other potential treatments  Chronic migraine without aura with status migrainosus, not intractable - resolved with 50mg  nortriptyline, cont.    Neck pain - improving with course of oral steroids from ortho, along with home exercises--continue  Declined flu and COVID boosters today, prefers to wait another week (due to taking oral steroids).  Discussed his meds in detail.  Tolerating the higher dose of pamelor without significant side effects, and is effective.  He will see Dr. Tomi Likens as scheduled, and can discuss continuing this vs alternative treatments (ie botox). Nurtec was more effective than the one triptan he tried.  He thinks he might need to try a second, but also thinks it might be covered better if comes from neurologist.  Will let neuro address his meds going forward.  If/when stable and doing well, can f/u here and we can continue meds.  All of his questions were  answered.  I spent 34 minutes dedicated to the care of this patient, including pre-visit review of records, face to face time, post-visit ordering of testing and documentation.

## 2021-08-17 ENCOUNTER — Ambulatory Visit: Payer: Managed Care, Other (non HMO) | Admitting: Family Medicine

## 2021-08-17 ENCOUNTER — Encounter: Payer: Self-pay | Admitting: Family Medicine

## 2021-08-17 ENCOUNTER — Other Ambulatory Visit: Payer: Self-pay

## 2021-08-17 VITALS — BP 128/84 | HR 84 | Ht 67.0 in | Wt 157.8 lb

## 2021-08-17 DIAGNOSIS — M542 Cervicalgia: Secondary | ICD-10-CM

## 2021-08-17 DIAGNOSIS — G43701 Chronic migraine without aura, not intractable, with status migrainosus: Secondary | ICD-10-CM

## 2021-08-17 DIAGNOSIS — G44229 Chronic tension-type headache, not intractable: Secondary | ICD-10-CM | POA: Diagnosis not present

## 2021-08-17 NOTE — Patient Instructions (Addendum)
Continue the Nortriptyline 50mg  daily. Continue to use the Nurtec if needed for a migraine. Going forwards, you may need to try a second triptan in order to get insurance to cover the Nurtec (unless it is covered if written by neurologist).  Alternatively, I believe there is a Passenger transport manager [employer]?) that you can continue to get it for free.  Dr. Georgie Chard office likely also has this information regarding this pharmacy.  If not, check with Mickel Baas in our office (she is the person who does our Prior Authorizations and should remember this pharmacy and have the information for it so that we can send a prescription there for you.

## 2021-09-12 NOTE — Progress Notes (Addendum)
NEUROLOGY CONSULTATION NOTE  Alexander Rivera MRN: [id number] DOB: July 18, 1986  Referring provider: Rita Ohara, MD Primary care provider: Harland Dingwall, PA-C  Reason for consult:  headache  Assessment/Plan:   Migraine without aura, without status migrainosus, not intractable  Migraine prevention:  Due to palpitations and chest discomfort, taper off nortriptyline-25mg  at bedtime for one week, then stop.  Start Ajovy every 28 days Migraine rescue:  Nurtec Limit use of pain relievers to no more than 2 days out of week to prevent risk of rebound or medication-overuse headache. Keep headache diary Follow up 6 months.  10/13/2021 ADDENDUM:  Received a note from patient's OD, Dr. Macarthur Critchley.  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  Vision is less clear in the left eye.  Therefore, I would like to order MRI of brain and orbits with and without contrast. Metta Clines, DO    Subjective:  Alexander Rivera is a 35 year old male with history of mediastinal seminoma who presents for headache.  History supplemented by primary care notes.  Onset:  [place] Location:  starts at base of skull and radiates up to right of vertex or behind right eye Quality:  pounding,  Intensity:  Severe.  He denies new headache, thunderclap headache or severe headache that wakes him from sleep. Aura:  absent Prodrome:  absent Associated symptoms:  Photophobia, sometimes nausea.  He denies associated phonophobia, osmophobia, visual disturbance, unilateral numbness or weakness. Duration:  all day.  With Nurtec, less than 1 hour Frequency:  2 to 3 days a week; 1 day a month since starting nortriptyline.   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   Nortriptyline effective but it causes palpitations and chest discomfort.   Current NSAIDS/analgesics:  Excedrin Migraine, meloxicam Current triptans:  none Current ergotamine:  none Current anti-emetic:  none Current muscle relaxants:  baclofen 20mg  TID Current Antihypertensive medications:  none Current Antidepressant medications:  nortriptyline 50mg  at bedtime Current Anticonvulsant medications:  none Current anti-CGRP:  none Current Vitamins/Herbal/Supplements:  none Current Antihistamines/Decongestants:  none Other therapy:  none Hormone/birth control:  none   Past NSAIDS/analgesics:  oxycodone Past abortive triptans:  naratriptan 2.5mg  Past abortive ergotamine:  none Past muscle relaxants:  none Past anti-emetic:  none Past antihypertensive medications:  clonidine Past antidepressant medications:  Cymbalta Past anticonvulsant medications:  topiramate, Lyrica Past anti-CGRP:  Nurtec (effective) Past vitamins/Herbal/Supplements:  none Past antihistamines/decongestants:  Benadryl Other past therapies:  none  Caffeine:  1 cup of coffee in morning Diet:  5 bottles of water daily.  Does not skip meals Exercise:  not routine Depression:  no; Anxiety:  no Other pain:  mild chronic pain from mediastinal seminoma resection. Sleep hygiene:  poor.  Insomnia Family history of headache:  mother, brother (both have visual auras)      PAST MEDICAL HISTORY: Past Medical History:  Diagnosis Date   Seminoma ([place])    mediastinal    PAST SURGICAL HISTORY: Past Surgical History:  Procedure Laterality Date   TUMOR REMOVAL     Chest (mediastinal seminoma excision); 2007-8    MEDICATIONS: Current Outpatient Medications on File Prior to Visit  Medication Sig Dispense Refill   aspirin-acetaminophen-caffeine (EXCEDRIN MIGRAINE) 250-250-65 MG tablet Take 2 tablets by mouth every  6 (six) hours as needed for headache. (Patient not taking: No sig reported)     baclofen (LIORESAL) 20  MG tablet Take 20 mg by mouth 3 (three) times daily.     methylPREDNISolone (MEDROL DOSEPAK) 4 MG TBPK tablet Take as directed 21 each 0   naratriptan (AMERGE) 2.5 MG tablet Take 1 tablet (2.5 mg total) by mouth as needed for migraine. Take one (1) tablet at onset of headache; if returns or does not resolve, may repeat after 4 hours; do not exceed five (5) mg in 24 hours. (Patient not taking: Reported on 08/17/2021) 10 tablet 0   nortriptyline (PAMELOR) 50 MG capsule Take 1 capsule (50 mg total) by mouth at bedtime. 30 capsule 2   Rimegepant Sulfate (NURTEC) 75 MG TBDP Take 75 mg by mouth every other day as needed (migraine). (Patient not taking: Reported on 08/17/2021) 15 tablet 0   No current facility-administered medications on file prior to visit.    ALLERGIES: No Known Allergies  FAMILY HISTORY: Family History  Problem Relation Age of Onset   Kidney cancer Paternal Uncle    Hypertension Mother    Asthma Mother    Crohn's disease Mother    Heart disease Father    Asthma Brother     Objective:  Blood pressure (!) 140/91, pulse 98, height 5\' 7"  (1.702 m), weight 158 lb 9.6 oz (71.9 kg), SpO2 98 %. General: No acute distress.  Patient appears well-groomed.   Head:  Normocephalic/atraumatic Eyes:  fundi examined but not visualized Neck: supple, no paraspinal tenderness, full range of motion Back: No paraspinal tenderness Heart: regular rate and rhythm Lungs: Clear to auscultation bilaterally. Vascular: No carotid bruits. Neurological Exam: Mental status: alert and oriented to person, place, and time, recent and remote memory intact, fund of knowledge intact, attention and concentration intact, speech fluent and not dysarthric, language intact. Cranial nerves: CN I: not tested CN II: pupils equal, round and reactive to light, visual fields intact CN III, IV, VI:  full range of motion, no nystagmus, no ptosis CN V: facial sensation intact. CN VII: upper and lower face  symmetric CN VIII: hearing intact CN IX, X: gag intact, uvula midline CN XI: sternocleidomastoid and trapezius muscles intact CN XII: tongue midline Bulk & Tone: normal, no fasciculations. Motor:  muscle strength 5/5 throughout Sensation:  Pinprick, temperature and vibratory sensation intact. Deep Tendon Reflexes:  2+ throughout,  toes downgoing.   Finger to nose testing:  Without dysmetria.   Heel to shin:  Without dysmetria.   Gait:  Normal station and stride.  Romberg negative.    Thank you for allowing me to take part in the care of this patient.  Metta Clines, DO  CC: Rita Ohara, MD

## 2021-09-13 ENCOUNTER — Encounter: Payer: Self-pay | Admitting: Neurology

## 2021-09-13 ENCOUNTER — Other Ambulatory Visit: Payer: Self-pay

## 2021-09-13 ENCOUNTER — Ambulatory Visit: Payer: Managed Care, Other (non HMO) | Admitting: Neurology

## 2021-09-13 VITALS — BP 140/91 | HR 98 | Ht 67.0 in | Wt 158.6 lb

## 2021-09-13 DIAGNOSIS — G43009 Migraine without aura, not intractable, without status migrainosus: Secondary | ICD-10-CM | POA: Diagnosis not present

## 2021-09-13 MED ORDER — NORTRIPTYLINE HCL 25 MG PO CAPS: ORAL_CAPSULE | ORAL | 0 refills | Status: DC

## 2021-09-13 MED ORDER — NURTEC 75 MG PO TBDP: 75.0000 mg | ORAL_TABLET | Freq: Every day | ORAL | 5 refills | Status: DC | PRN

## 2021-09-13 NOTE — Patient Instructions (Signed)
  Start Ajovy every 28 days Take nortriptyline 25mg  at bedtime for one week, then stop Take Nurtec at earliest onset of headache.  Maximum 1 tablet in 24 hours. Limit use of pain relievers to no more than 2 days out of the week.  These medications include acetaminophen, NSAIDs (ibuprofen/Advil/Motrin, naproxen/Aleve, triptans (Imitrex/sumatriptan), Excedrin, and narcotics.  This will help reduce risk of rebound headaches. Be aware of common food triggers:  - Caffeine:  coffee, black tea, cola, Mt. Dew  - Chocolate  - Dairy:  aged cheeses (brie, blue, cheddar, gouda, [place], provolone, [place], Swiss, etc), chocolate milk, buttermilk, sour cream, limit eggs and yogurt  - Nuts, peanut butter  - Alcohol  - Cereals/grains:  FRESH breads (fresh bagels, sourdough, doughnuts), yeast productions  - Processed/canned/aged/cured meats (pre-packaged deli meats, hotdogs)  - MSG/glutamate:  soy sauce, flavor enhancer, pickled/preserved/marinated foods  - Sweeteners:  aspartame (Equal, Nutrasweet).  Sugar and Splenda are okay  - Vegetables:  legumes (lima beans, lentils, snow peas, fava beans, pinto peans, peas, garbanzo beans), sauerkraut, onions, olives, pickles  - Fruit:  avocados, bananas, citrus fruit (orange, lemon, grapefruit), mango  - Other:  Frozen meals, macaroni and cheese Routine exercise Stay adequately hydrated (aim for 64 oz water daily) Keep headache diary Maintain proper stress management Maintain proper sleep hygiene Do not skip meals Consider supplements:  magnesium citrate 400mg  daily, riboflavin 400mg  daily, coenzyme Q10 100mg  three times daily.

## 2021-09-20 ENCOUNTER — Encounter: Payer: Self-pay | Admitting: Family Medicine

## 2021-09-20 ENCOUNTER — Ambulatory Visit: Payer: Managed Care, Other (non HMO) | Admitting: Family Medicine

## 2021-09-20 ENCOUNTER — Other Ambulatory Visit: Payer: Self-pay

## 2021-09-20 VITALS — BP 118/84 | HR 112 | Ht 67.0 in | Wt 157.8 lb

## 2021-09-20 DIAGNOSIS — R000 Tachycardia, unspecified: Secondary | ICD-10-CM

## 2021-09-20 DIAGNOSIS — R079 Chest pain, unspecified: Secondary | ICD-10-CM

## 2021-09-20 DIAGNOSIS — Z859 Personal history of malignant neoplasm, unspecified: Secondary | ICD-10-CM | POA: Diagnosis not present

## 2021-09-20 NOTE — Progress Notes (Signed)
Chief Complaint  Patient presents with   Chest Pain    Fluttering chest pain over the last week, last night sharp pain while watching TV and made him jump up. Woke up in the middle of the night last night and was laying there so he checked his pulse as he felt like his heart was racing and it was 133-then later it feels normal (but didn't check again). BP was two nights ago was 140/90. Has two nights left of nortriptyline (Dr. Tomi Likens weaning him off). Started Ajovy on 09/13/21-he read that chest pain and tachycardia could be side effects.     2-3 weeks ago he noted heart pounding at night--hard and fast, while laying in bed. He later noted some fluttering chest pains (a week later).  Last night a sharp pain suddenly occurred while watching TV.  Short-lived, with a few flutters of pain over the next 5-10 minutes.   Tachycardia seemed sudden, and return back to normal was also sudden. Once was at work, other times when at home. Not getting regular exercise recenty (triggered migraines)  Using wife's pulse ox, and noted pulse to be 133 last night. Other times was 118. He has noted to have elevated pulse in our office at prior visits. He reports that his heart beat feels fast, sometimes skips beats.  When it is fast, it feels like it is pounding hard, different from when it is fast other times (such as with exercise).  He describes an episode where he noted his knuckles were red, veins in hand were more noticeable.  Checked BP and it was high.  This occurred twice--once 2 weeks ago, and the other night  He stays well hydrated, drinks coffee only in the morning. He is wondering if any of this could be side effects from his migraine medications.  He had the tachycardia prior to starting the Ajovy, and was part of the reason to taper down on the nortryptiline.  H/o blood clots related to when he had mediastinal seminoma (2007-8)   PMH, PSH, SH reviewed FH--father has defibrillator/pacemaker (late  50s-60).   Outpatient Encounter Medications as of 09/20/2021  Medication Sig Note   Fremanezumab-vfrm (AJOVY) 225 MG/1.5ML SOAJ Inject into the skin. 09/20/2021: First injection 09/13/21 (sample)   nortriptyline (PAMELOR) 25 MG capsule Take 1 capsule at bedtime for one week, then stop    Rimegepant Sulfate (NURTEC) 75 MG TBDP Take 75 mg by mouth daily as needed. (Patient not taking: Reported on 09/20/2021)    [DISCONTINUED] meloxicam (MOBIC) 15 MG tablet Take 15 mg by mouth daily.    No facility-administered encounter medications on file as of 09/20/2021.   No Known Allergies  ROS: no fever, chills, URI symptoms, shortness of breath.  Intermittent short-lived CP and episodes of tachycardia per HPI.  No GI complaints.   He reports getting some numbness in various toes periodically, no color change. Has noticed this on various toes, on both feet, never painful or persistent. Denies anxiety.   PHYSICAL EXAM:   BP 118/84   Pulse (!) 112   Ht $R'5\' 7"'QB$  (1.702 m)   Wt 157 lb 12.8 oz (71.6 kg)   BMI 24.71 kg/m   Wt Readings from Last 3 Encounters:  09/20/21 157 lb 12.8 oz (71.6 kg)  09/13/21 158 lb 9.6 oz (71.9 kg)  08/17/21 157 lb 12.8 oz (71.6 kg)   Well appearing male, accompanied by his wife. He is alert and oriented, and in no distress HEENT: conjunctiva and sclera are  clear, EOMI, wearing mask Heart: tachycardic. No ectopy noted. No murmur, rub, gallop Neck: no lymphadenopathy, thyromegaly or carotid bruit Abdomen: soft, nontender, no mass Chest: no chest wall tenderness Extremities: no edema Neuro: alert and oriented, normal strength, gait  EKG: NSR (pulse slower at time of EKG); biatrial enlargement noted. No other abnormalities noted.  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 Will start with labs, then refer to cardiology for monitor/echo - Plan: TSH, CBC with Differential/Platelet, Comprehensive metabolic panel, DG Chest 2  View, EKG 12-Lead  Chest pain, unspecified type - Plan: DG Chest 2 View, EKG 12-Lead  History of cancer - h/o mediastinal seminoma, and pressure from tumor contributed to blood clots in past. Having CP, so will check CXR - Plan: DG Chest 2 View  Cbc, c-met, TSH If labs okay--refer to cardiology for eval--monitor/echo CXR  Educated re poss Ddx. If pSVT, reviewed possible triggers, maneuvers to break it (though his seem to be short-lived). Reviewed s/sx for which he should seek care in ER All questions of pt and wife answered.

## 2021-09-20 NOTE — Patient Instructions (Signed)
We are checking EKG and bloodwork. If no other cause for your episodic tachycardia is found (or your ongoing baseline fast heart rate), then we will be referring you to cardiology.

## 2021-09-21 ENCOUNTER — Telehealth: Payer: Self-pay

## 2021-09-21 ENCOUNTER — Encounter: Payer: Self-pay | Admitting: Family Medicine

## 2021-09-21 LAB — COMPREHENSIVE METABOLIC PANEL
ALT: 32 IU/L (ref 0–44)
AST: 28 IU/L (ref 0–40)
Albumin/Globulin Ratio: 1.7 (ref 1.2–2.2)
Albumin: 4.9 g/dL (ref 4.0–5.0)
Alkaline Phosphatase: 86 IU/L (ref 44–121)
BUN/Creatinine Ratio: 12 (ref 9–20)
BUN: 13 mg/dL (ref 6–20)
Bilirubin Total: 0.4 mg/dL (ref 0.0–1.2)
CO2: 26 mmol/L (ref 20–29)
Calcium: 10.4 mg/dL — ABNORMAL HIGH (ref 8.7–10.2)
Chloride: 98 mmol/L (ref 96–106)
Creatinine, Ser: 1.13 mg/dL (ref 0.76–1.27)
Globulin, Total: 2.9 g/dL (ref 1.5–4.5)
Glucose: 100 mg/dL — ABNORMAL HIGH (ref 70–99)
Potassium: 5.1 mmol/L (ref 3.5–5.2)
Sodium: 140 mmol/L (ref 134–144)
Total Protein: 7.8 g/dL (ref 6.0–8.5)
eGFR: 86 mL/min/{1.73_m2} (ref >59)

## 2021-09-21 LAB — CBC WITH DIFFERENTIAL/PLATELET
Basophils Absolute: 0 10*3/uL (ref 0.0–0.2)
Basos: 0 %
EOS (ABSOLUTE): 0.1 10*3/uL (ref 0.0–0.4)
Eos: 2 %
Hematocrit: 52.8 % — ABNORMAL HIGH (ref 37.5–51.0)
Hemoglobin: 17.6 g/dL (ref 13.0–17.7)
Immature Grans (Abs): 0 10*3/uL (ref 0.0–0.1)
Immature Granulocytes: 0 %
Lymphocytes Absolute: 1.8 10*3/uL (ref 0.7–3.1)
Lymphs: 32 %
MCH: 29.6 pg (ref 26.6–33.0)
MCHC: 33.3 g/dL (ref 31.5–35.7)
MCV: 89 fL (ref 79–97)
Monocytes Absolute: 0.5 10*3/uL (ref 0.1–0.9)
Monocytes: 8 %
Neutrophils Absolute: 3.1 10*3/uL (ref 1.4–7.0)
Neutrophils: 58 %
Platelets: 261 10*3/uL (ref 150–450)
RBC: 5.94 x10E6/uL — ABNORMAL HIGH (ref 4.14–5.80)
RDW: 12.7 % (ref 11.6–15.4)
WBC: 5.4 10*3/uL (ref 3.4–10.8)

## 2021-09-21 LAB — TSH: TSH: 1.97 u[IU]/mL (ref 0.450–4.500)

## 2021-09-21 NOTE — Addendum Note (Signed)
Addended by: Rita Ohara on: 09/21/2021 05:30 PM   Modules accepted: Orders

## 2021-09-21 NOTE — Telephone Encounter (Signed)
New message - website CoverMyMeds   Submit prior authorization on 09/15/21 & 09/19/21 Nurtec & Ajovy There was an error with your request Patient not found / Message from Plan Patient not found  Call customer services line [phone]  was direct to [facility] [phone]   Call [facility] spoke with Samatha to initiate prior authorization medication Nurtec & Ajovy.  Alexander Rivera will need to speak with the patient first on cost and delivery prior authorization is not required as what she can see, once she has obtained information from the patient, a request will be sent to MD.

## 2021-09-23 ENCOUNTER — Encounter: Payer: Self-pay | Admitting: Family Medicine

## 2021-09-26 ENCOUNTER — Ambulatory Visit
Admission: RE | Admit: 2021-09-26 | Discharge: 2021-09-26 | Disposition: A | Discharge status: Home / Self Care | Payer: Managed Care, Other (non HMO) | Source: Ambulatory Visit | Attending: Family Medicine | Admitting: Family Medicine

## 2021-09-26 DIAGNOSIS — Z859 Personal history of malignant neoplasm, unspecified: Secondary | ICD-10-CM

## 2021-09-26 DIAGNOSIS — R000 Tachycardia, unspecified: Secondary | ICD-10-CM

## 2021-09-26 DIAGNOSIS — R079 Chest pain, unspecified: Secondary | ICD-10-CM

## 2021-09-26 IMAGING — CR DG CHEST 2V
2 series · 2 of 2 positions shown · non-contrast
Comparison: None.

CLINICAL DATA: History of mediastinal seminoma chest pain

EXAM:
CHEST - 2 VIEW

[w chest pa]
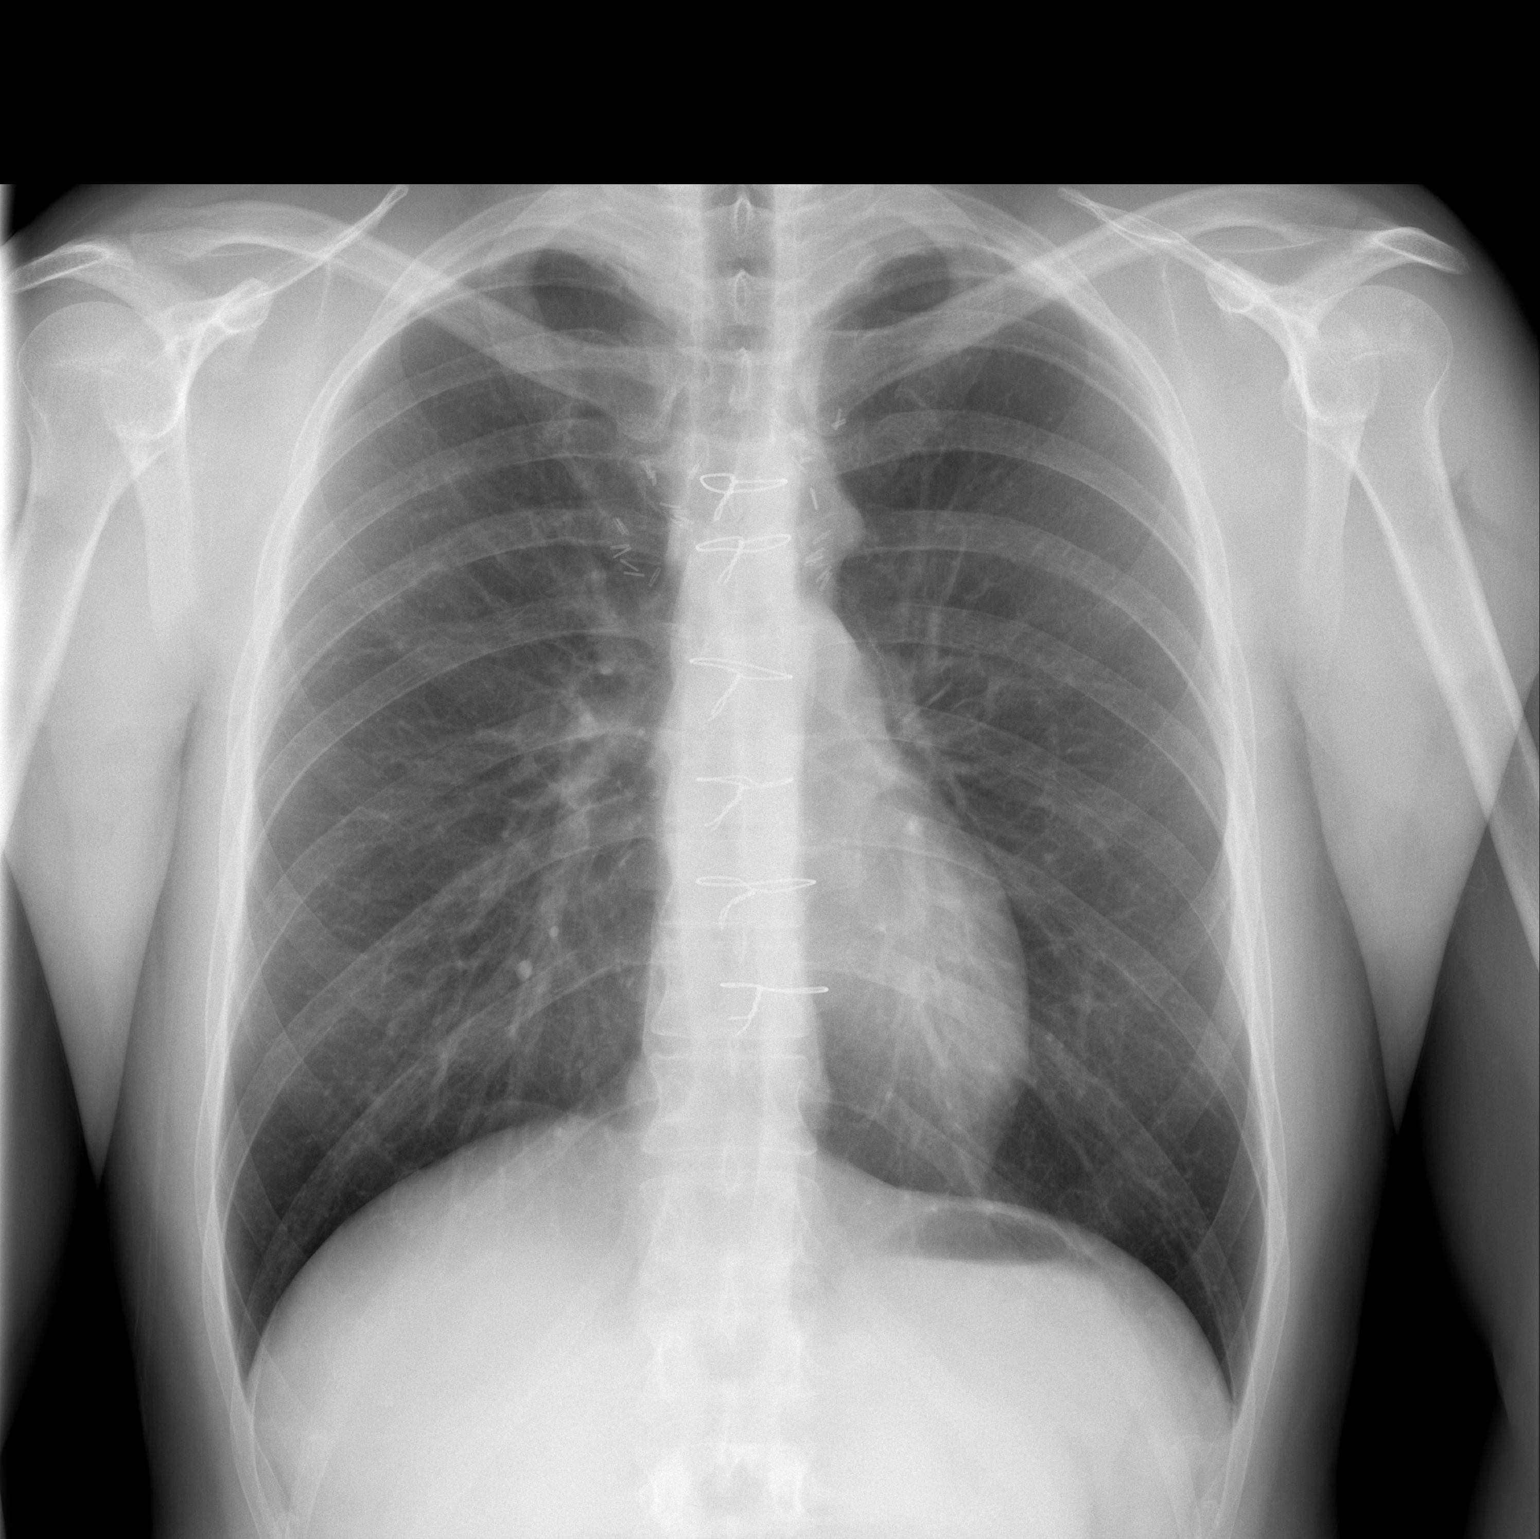

[w chest lat]
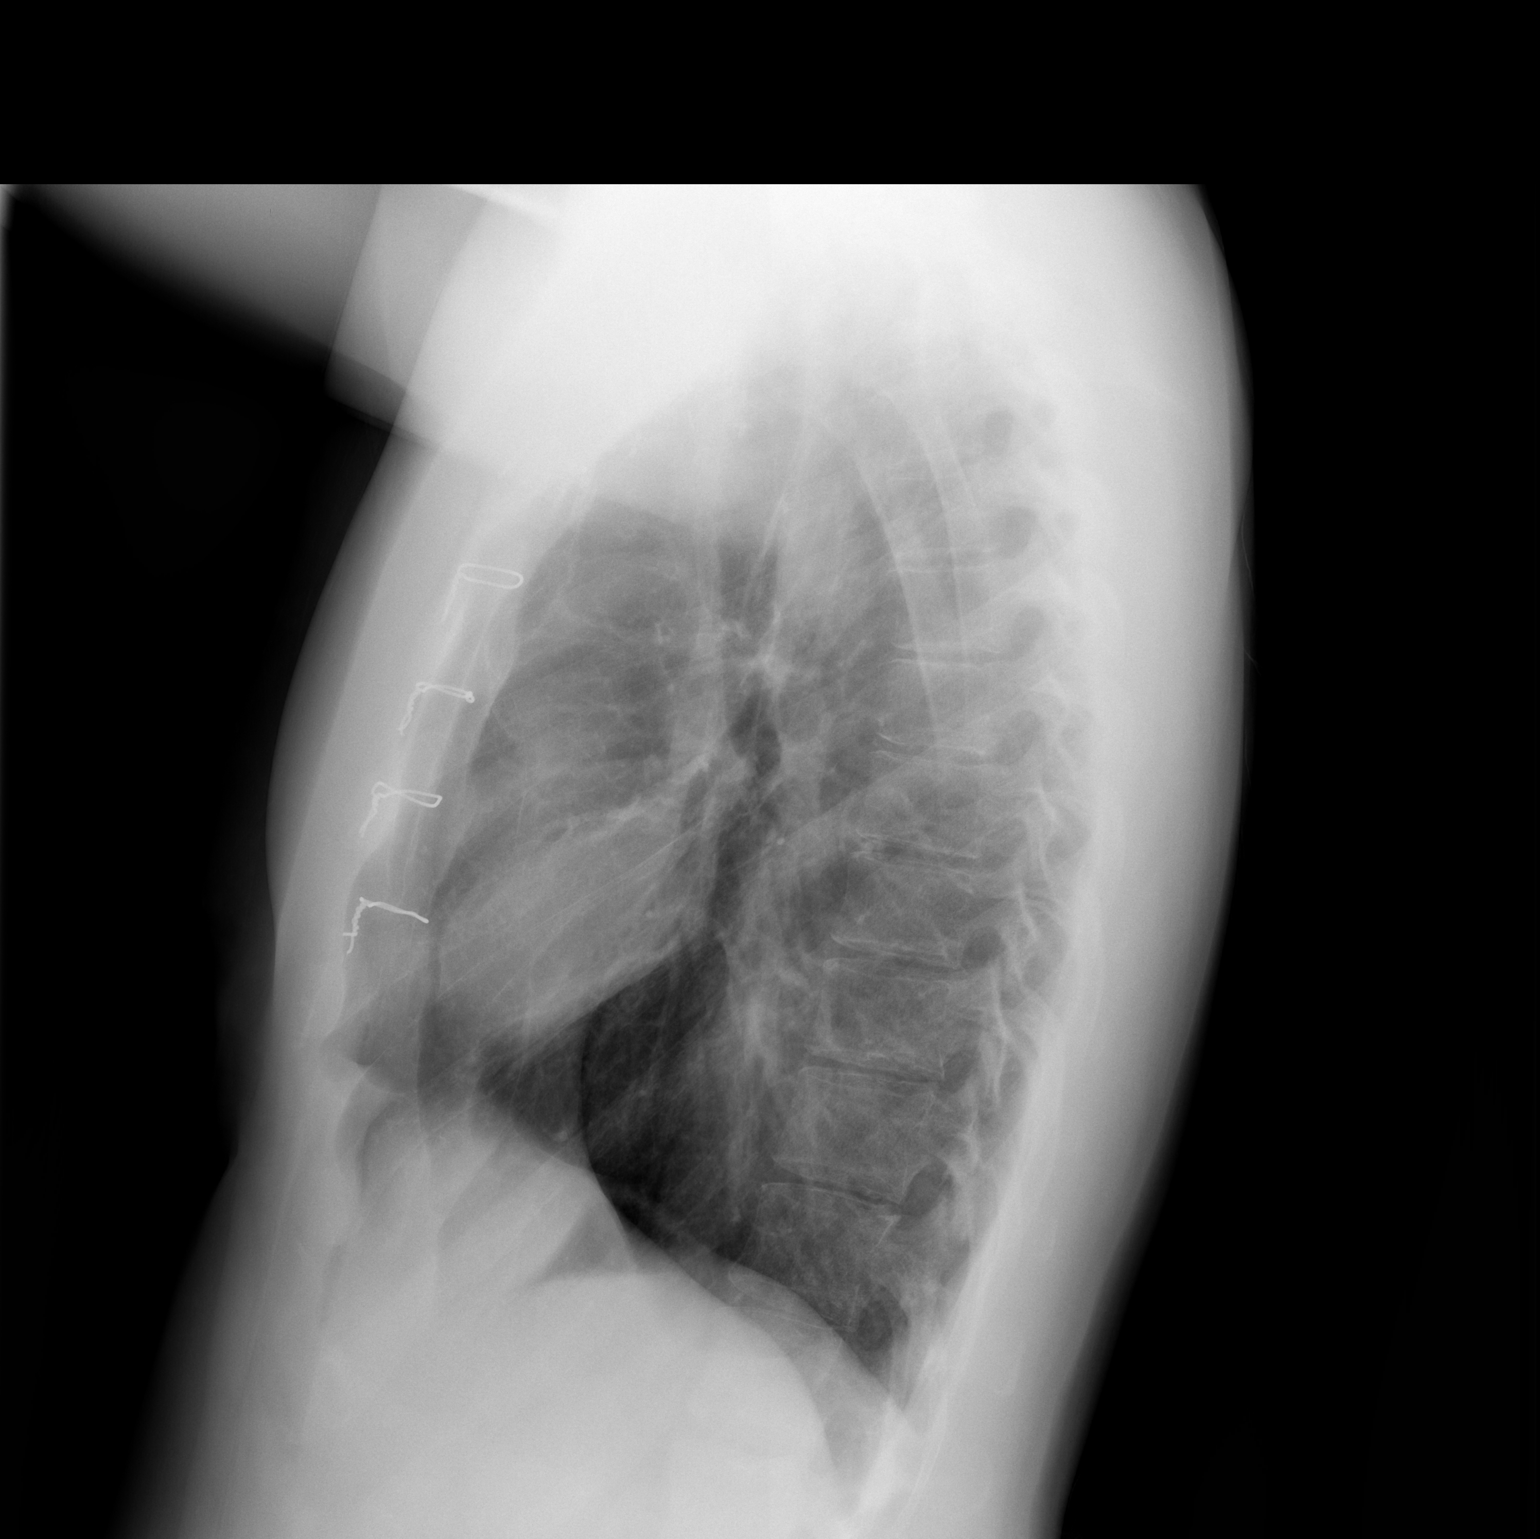

[2 of 2 positions shown; findings below may reference images not displayed]

FINDINGS: Post sternotomy changes. Surgical clips in the mediastinum. No focal
opacity, pleural effusion or pneumothorax. Normal cardiac size.
IMPRESSION: No active cardiopulmonary disease. Postsurgical changes of the
mediastinum

## 2021-09-27 ENCOUNTER — Telehealth: Payer: Self-pay | Admitting: Neurology

## 2021-09-27 NOTE — Telephone Encounter (Signed)
Pt wife would like a call back regarding his PA for nurtec.

## 2021-10-05 ENCOUNTER — Telehealth: Payer: Self-pay

## 2021-10-05 NOTE — Telephone Encounter (Signed)
New message    Initiate prior authorization over the phone with an [facility] representative at [phone]   Medication Nurtec & Ajovy

## 2021-10-05 NOTE — Telephone Encounter (Signed)
New message    Initiate prior authorization over the phone with an [facility] representative at [phone]   Medication Nurtec & Ajovy

## 2021-10-10 ENCOUNTER — Encounter: Payer: Self-pay | Admitting: Family Medicine

## 2021-10-13 ENCOUNTER — Telehealth: Payer: Self-pay | Admitting: Neurology

## 2021-10-13 ENCOUNTER — Telehealth: Payer: Self-pay

## 2021-10-13 DIAGNOSIS — H539 Unspecified visual disturbance: Secondary | ICD-10-CM

## 2021-10-13 NOTE — Addendum Note (Signed)
Addended by: Venetia Night on: 10/13/2021 09:20 AM   Modules accepted: Orders

## 2021-10-13 NOTE — Telephone Encounter (Signed)
Please see other Phone note for today.

## 2021-10-13 NOTE — Telephone Encounter (Signed)
This has been sent to clinical review pending at this time with [facility]

## 2021-10-13 NOTE — Telephone Encounter (Signed)
-----   Message from Pieter Partridge, DO sent at 10/13/2021  6:36 AM EST ----- Alexander Rivera, Received a letter from patient's eye doctor.  He has an abnormality in the left eye/visual disturbance left eye.  Therefore, I would like to order MRI of brain and orbits with and without contrast. Thank you

## 2021-10-13 NOTE — Telephone Encounter (Signed)
LMOVM for pt to call the office back, MRI Brain and Orbits W/WO Contrast.

## 2021-10-13 NOTE — Telephone Encounter (Signed)
Patient called [facility]. They do have some appt open for MRI.

## 2021-10-13 NOTE — Telephone Encounter (Signed)
Per Pt he will call [facility] to see if they will be able to get him before the end of the year.   Pt called back Please send order to [place].    Order in [place] folder to be done.

## 2021-10-20 ENCOUNTER — Telehealth: Payer: Self-pay

## 2021-10-20 NOTE — Telephone Encounter (Signed)
LMOVM, Please call the office back, MRI scheduled with [facility] imaging already do you want to keep that appt. Need to know so will continue doing the PA

## 2021-10-23 ENCOUNTER — Ambulatory Visit: Payer: Managed Care, Other (non HMO) | Admitting: Cardiology

## 2021-10-31 ENCOUNTER — Ambulatory Visit
Admission: RE | Admit: 2021-10-31 | Discharge: 2021-10-31 | Disposition: A | Discharge status: Home / Self Care | Payer: Managed Care, Other (non HMO) | Source: Ambulatory Visit | Attending: Neurology | Admitting: Neurology

## 2021-10-31 ENCOUNTER — Other Ambulatory Visit: Payer: Self-pay

## 2021-10-31 DIAGNOSIS — H539 Unspecified visual disturbance: Secondary | ICD-10-CM

## 2021-10-31 IMAGING — MR MR ORBITS WO/W CM
4 of 6 series · 20 of 48 positions shown · IV contrast (15 ml multihance)
Comparison: None.

CLINICAL DATA: Presentation for headache. Refractive shift of left
eye. Visual disturbance of left eye. History of mediastinal seminoma
in [NM]

EXAM:
MRI HEAD AND ORBITS WITHOUT AND WITH CONTRAST
TECHNIQUE: Multiplanar, multiecho pulse sequences of the brain and surrounding
structures were obtained without and with intravenous contrast.
Multiplanar, multiecho pulse sequences of the orbits and surrounding
structures were obtained including fat saturation techniques, before
and after intravenous contrast administration.
CONTRAST:  15mL MULTIHANCE GADOBENATE DIMEGLUMINE 529 MG/ML IV SOLN

[Series 1: T1 · coronal · 3.0mm · 0.35mm/px · 3 of 26 slices shown (1 of 2)]
[im 3/26]
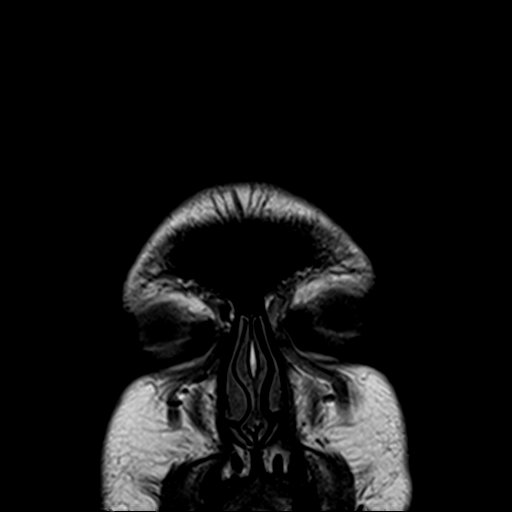
[im 14/26]
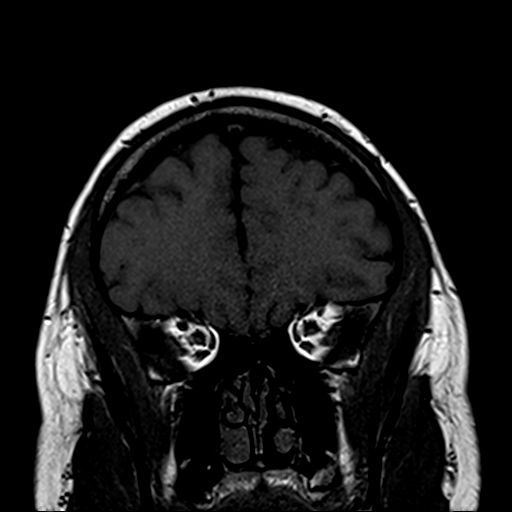
[im 23/26]
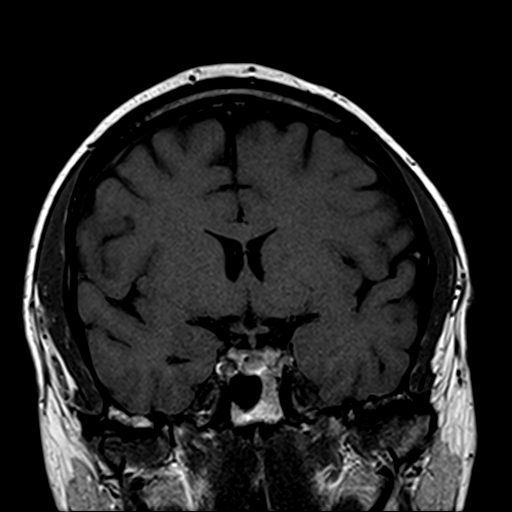

[Series 2: T2 fat-sat · coronal · 3.0mm · 0.35mm/px · 8 of 26 slices shown (1 of 2)]
[im 1/26]
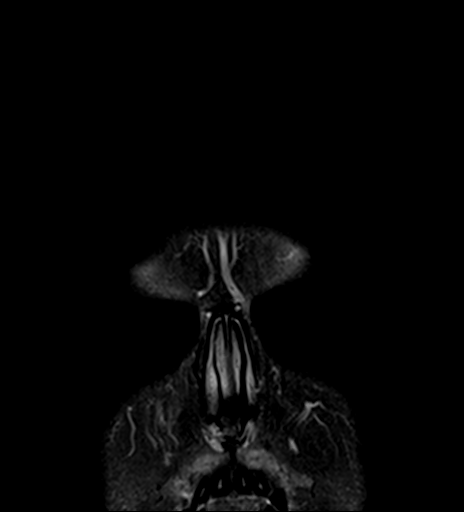
[im 3/26]
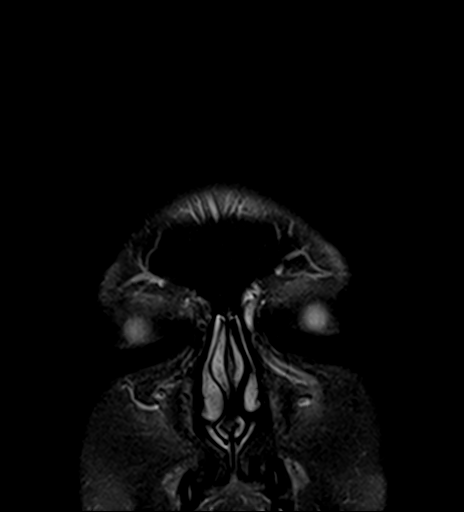
[im 9/26]
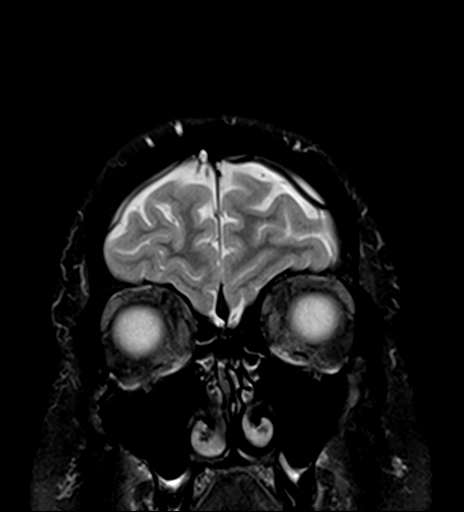
[im 12/26]
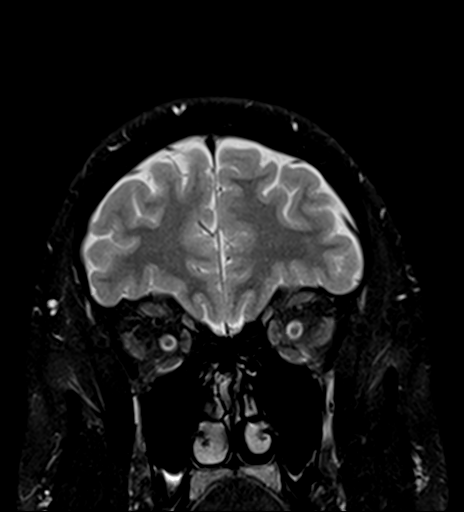
[im 14/26]
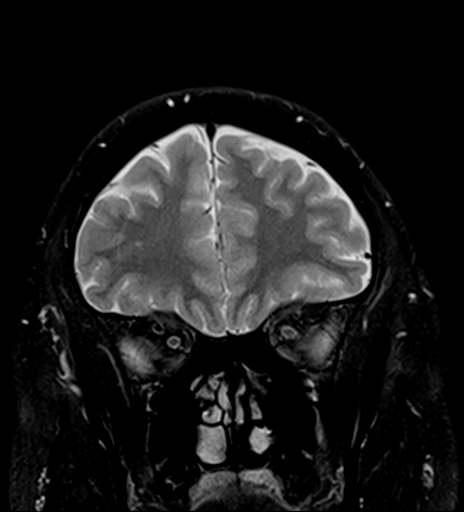
[im 17/26]
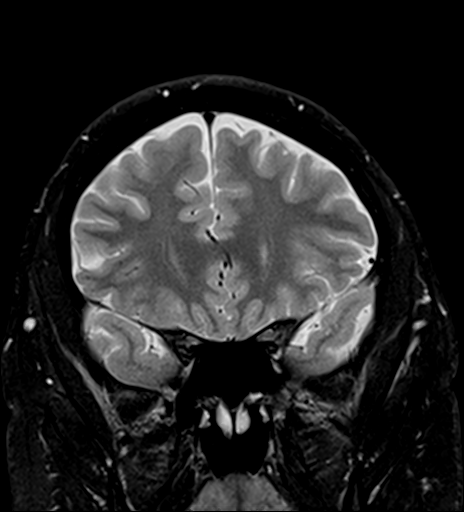
[im 23/26]
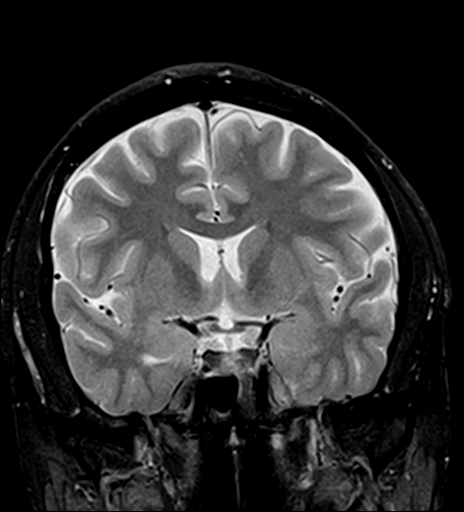
[im 26/26]
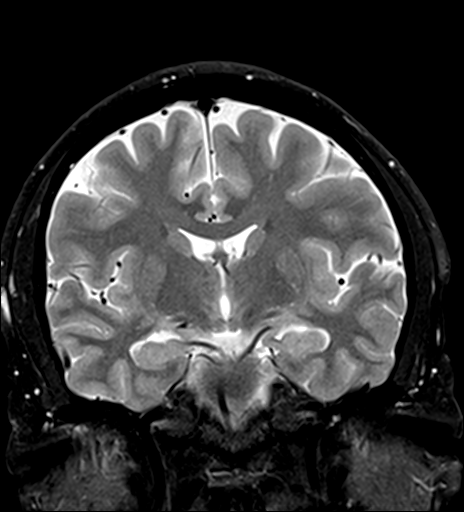

[Series 3: T1 · axial · 3.0mm · 0.35mm/px · z∈[+21,+60]mm · 3 of 15 slices shown (2 of 2)]
[im 3/15]
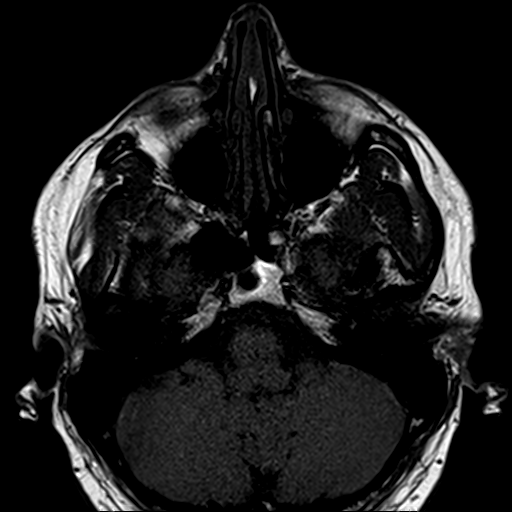
[im 9/15]
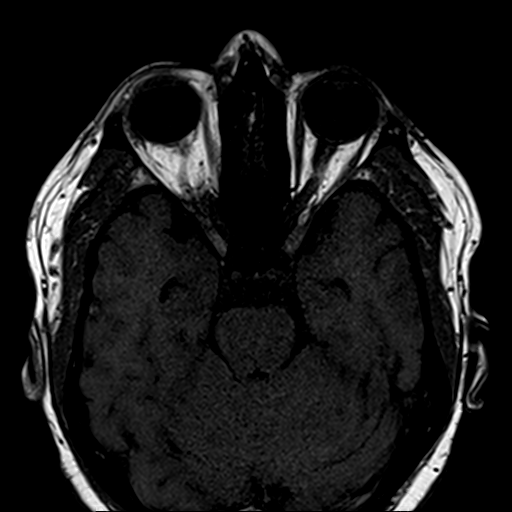
[im 15/15]
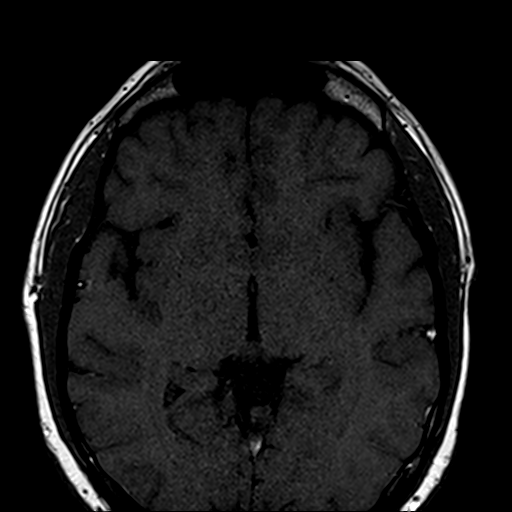

[Series 4: T2 fat-sat · axial · 3.0mm · 0.35mm/px · z∈[+16,+61]mm · 6 of 15 slices shown (2 of 2)]
[im 1/15]
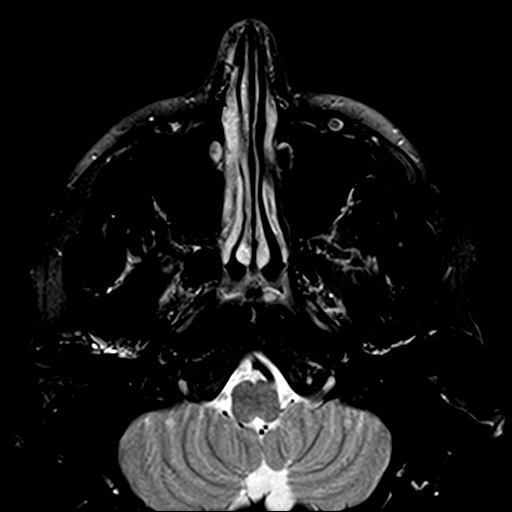
[im 3/15]
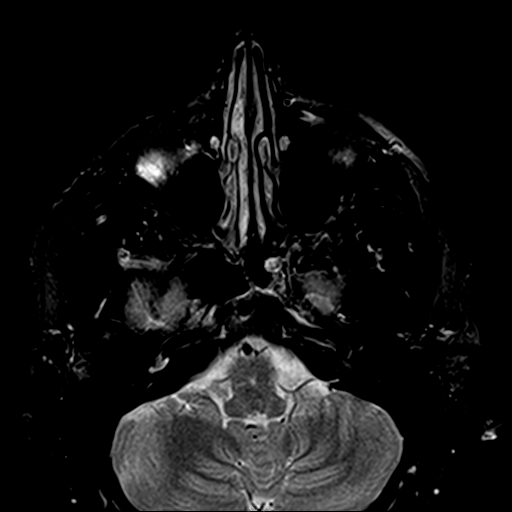
[im 6/15]
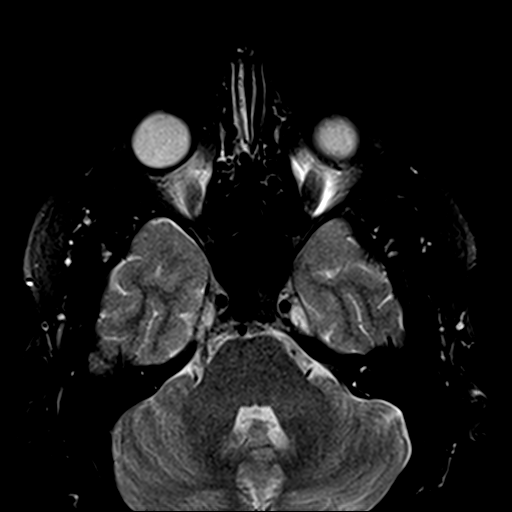
[im 9/15]
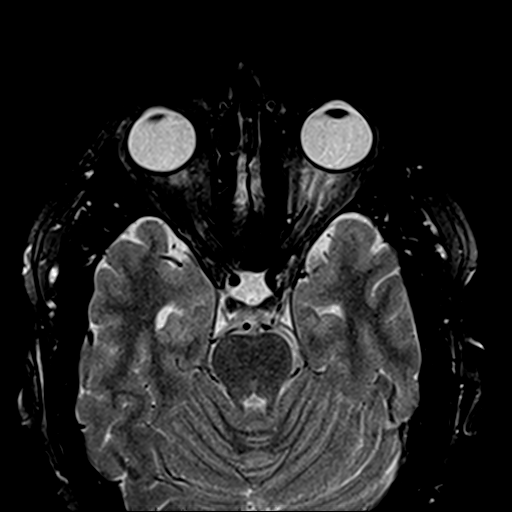
[im 12/15]
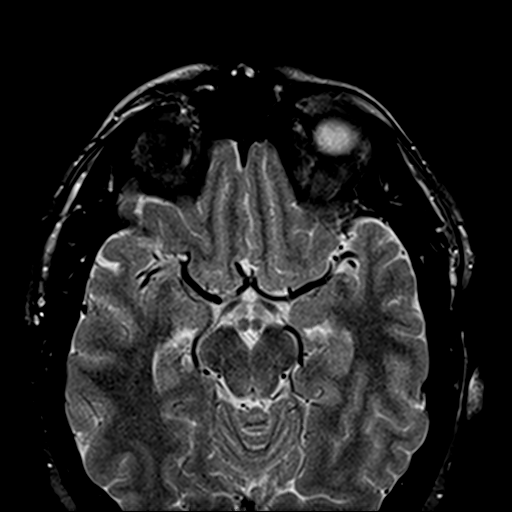
[im 15/15]
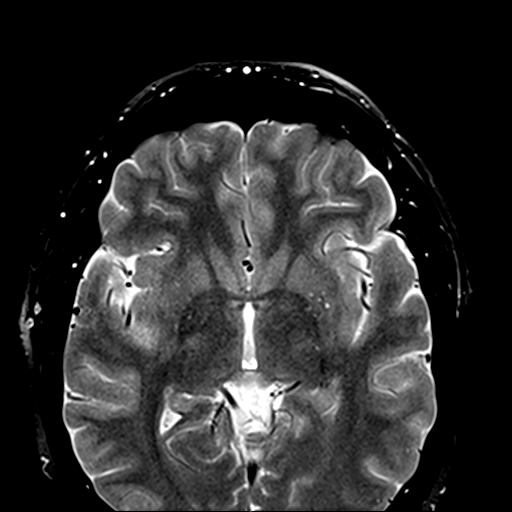

[20 of 48 positions shown; findings below may reference images not displayed]

FINDINGS: MRI HEAD FINDINGS

Brain: No white matter disease, infarction, hemorrhage,
hydrocephalus, extra-axial collection or mass lesion.

Vascular: Normal flow voids and vascular enhancements

Skull and upper cervical spine: Normal marrow signal

MRI ORBITS FINDINGS

Orbits: No traumatic, tumoral, or inflammatory finding. Globes,
optic nerves, orbital fat, extraocular muscles, vascular structures,
and lacrimal glands are normal.

Visualized sinuses: Clear

Soft tissues: Negative
IMPRESSION: Normal MRI of the brain and orbits.

## 2021-10-31 IMAGING — MR MR HEAD WO/W CM
12 of 13 series · 41 of 48 positions shown · IV contrast (multihance)
Comparison: None.

CLINICAL DATA: Presentation for headache. Refractive shift of left
eye. Visual disturbance of left eye. History of mediastinal seminoma
in [NM]

EXAM:
MRI HEAD AND ORBITS WITHOUT AND WITH CONTRAST
TECHNIQUE: Multiplanar, multiecho pulse sequences of the brain and surrounding
structures were obtained without and with intravenous contrast.
Multiplanar, multiecho pulse sequences of the orbits and surrounding
structures were obtained including fat saturation techniques, before
and after intravenous contrast administration.
CONTRAST:  15mL MULTIHANCE GADOBENATE DIMEGLUMINE 529 MG/ML IV SOLN

[Series 2: T1 · sagittal · 5.0mm · 0.45mm/px · 1 of 23 slices shown]
[im 1/23]
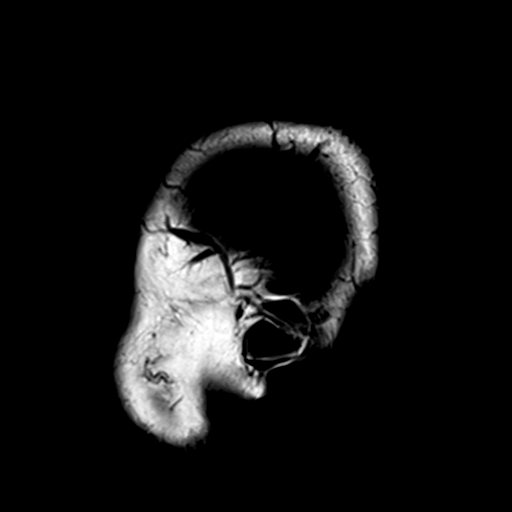

[Series 3: DWI · axial · 3.0mm · 1.80mm/px · z∈[-15,+130]mm · 5 of 99 slices shown (1 of 4)]
[im 1/99]
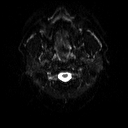
[im 25/99]
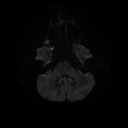
[im 50/99]
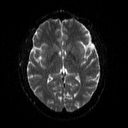
[im 74/99]
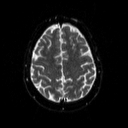
[im 99/99]
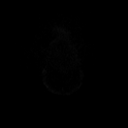

[Series 4: DWI · axial · 3.0mm · 1.80mm/px · z∈[-15,+130]mm · 3 of 48 slices shown (2 of 4)]
[im 1/48]
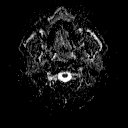
[im 24/48]
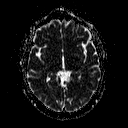
[im 48/48]
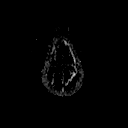

[Series 5: DWI · coronal · 5.0mm · 1.80mm/px · 4 of 70 slices shown (3 of 4)]
[im 1/70]
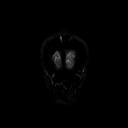
[im 24/70]
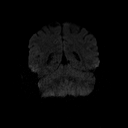
[im 47/70]
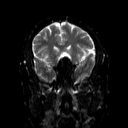
[im 70/70]
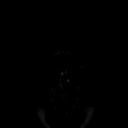

[Series 6: DWI · coronal · 5.0mm · 1.80mm/px · 2 of 35 slices shown (4 of 4)]
[im 1/35]
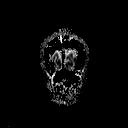
[im 35/35]
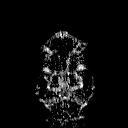

[Series 7: FLAIR · axial · 3.0mm · 0.45mm/px · 1 of 25 slices shown]
[im 1/25]
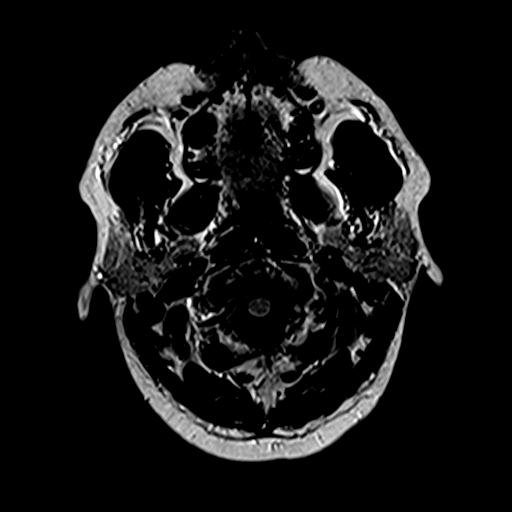

[Series 8: T2 · axial · 5.0mm · 0.51mm/px · z∈[-19,+136]mm · 2 of 26 slices shown (1 of 2)]
[im 1/26]
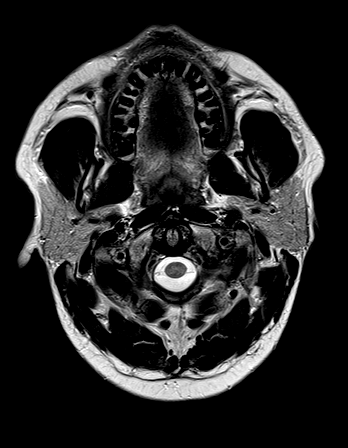
[im 26/26]
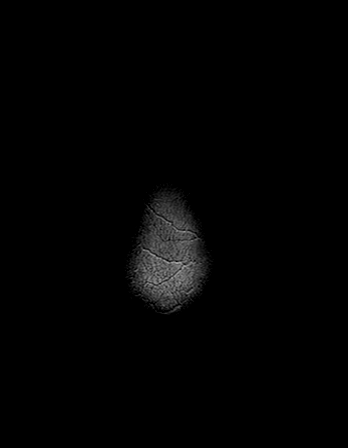

[Series 10: swi_images · axial · 2.0mm · 0.90mm/px · z∈[-19,+137]mm · 5 of 80 slices shown]
[im 1/80]
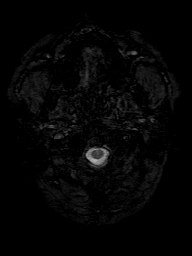
[im 20/80]
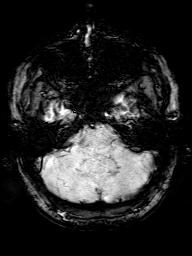
[im 40/80]
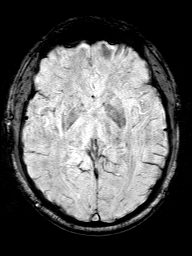
[im 60/80]
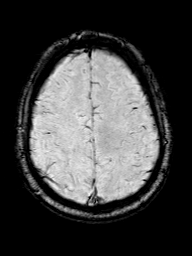
[im 80/80]
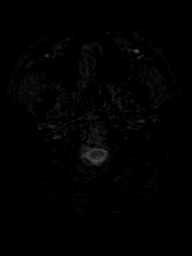

[Series 11: t1_mpr_tra copy center · axial · 1.0mm · 0.45mm/px · z∈[-20,+137]mm · 8 of 160 slices shown]
[im 1/160]
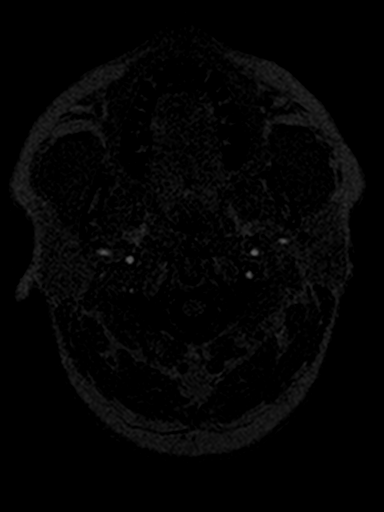
[im 18/160]
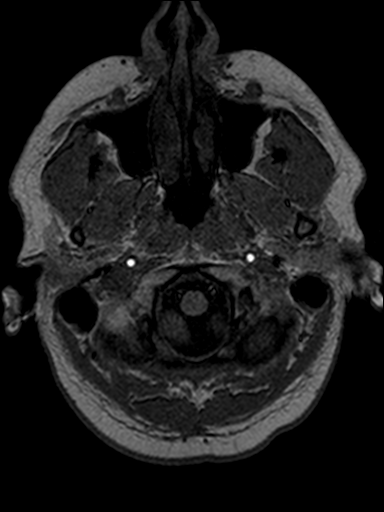
[im 54/160]
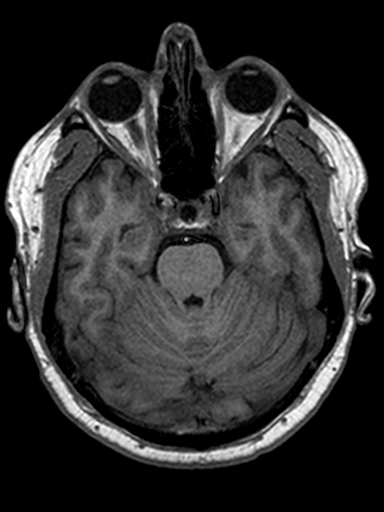
[im 71/160]
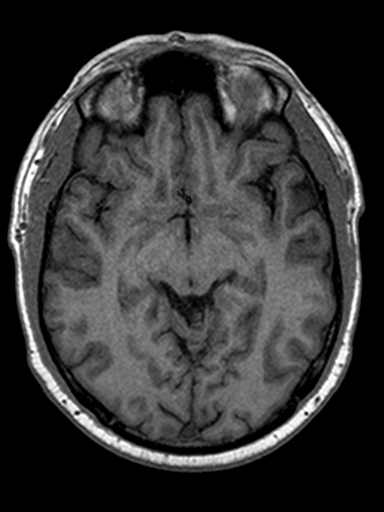
[im 89/160]
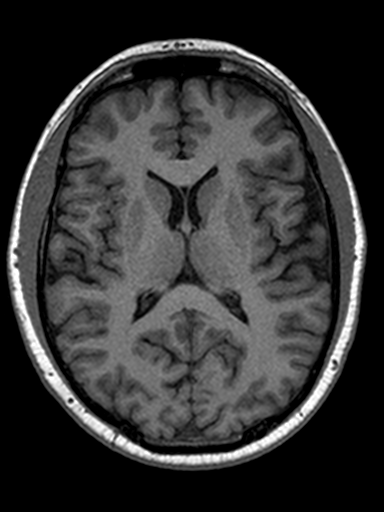
[im 107/160]
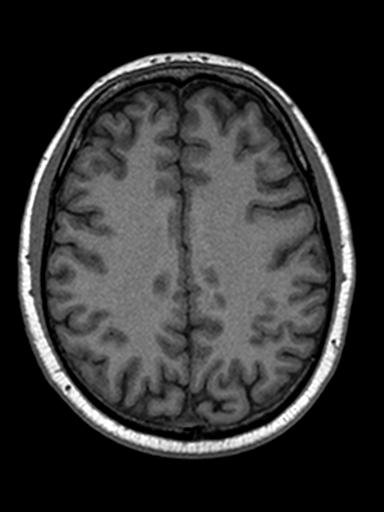
[im 142/160]
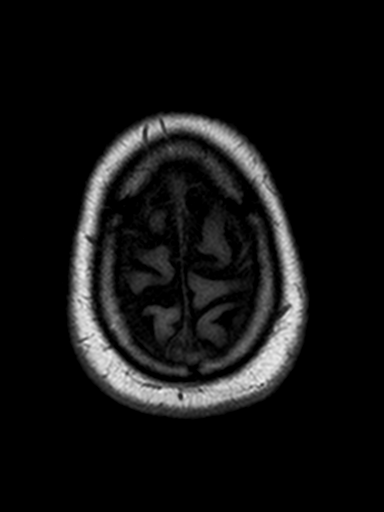
[im 160/160]
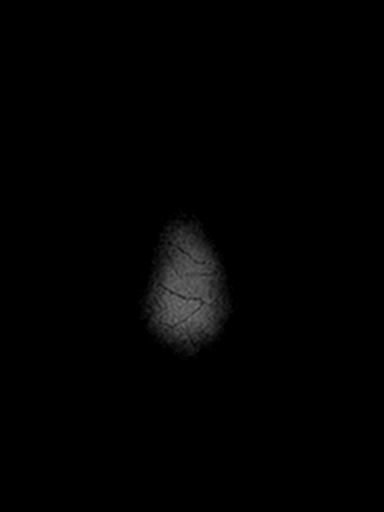

[Series 12: T2 · coronal · 5.0mm · 0.45mm/px · 2 of 30 slices shown (2 of 2)]
[im 1/30]
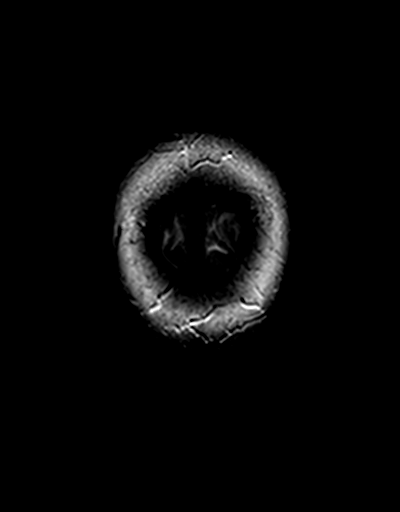
[im 30/30]
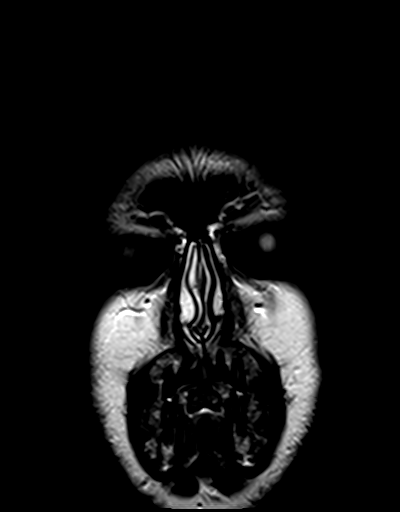

[Series 13: post_t1_mpr_tra · axial · 1.0mm · 0.45mm/px · z∈[-20,+85]mm · 6 of 160 slices shown]
[im 1/160]
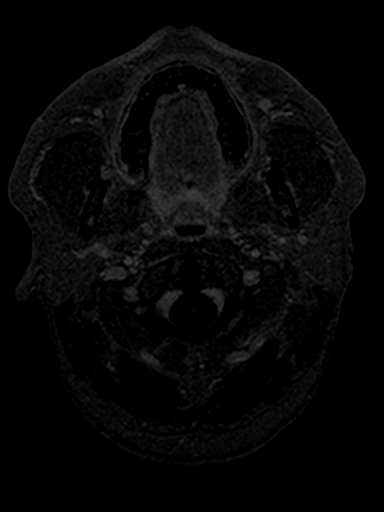
[im 18/160]
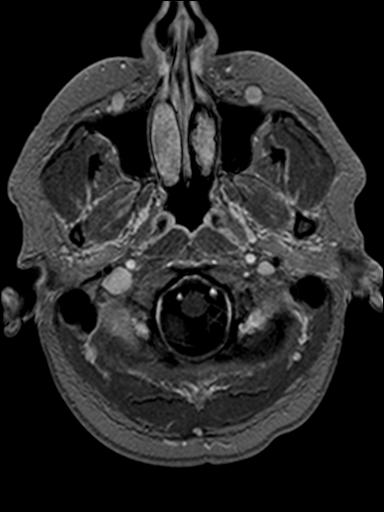
[im 54/160]
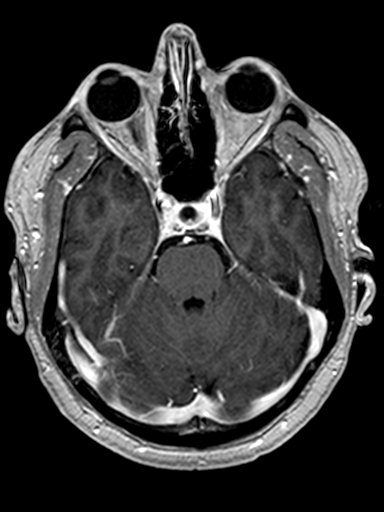
[im 71/160]
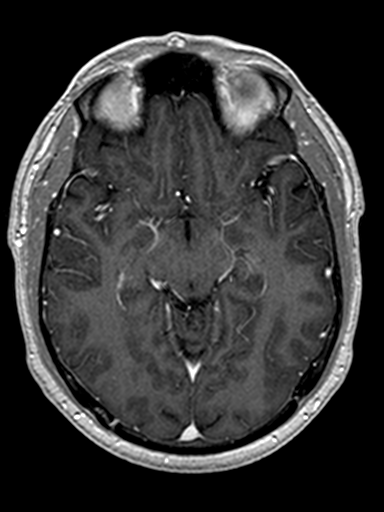
[im 89/160]
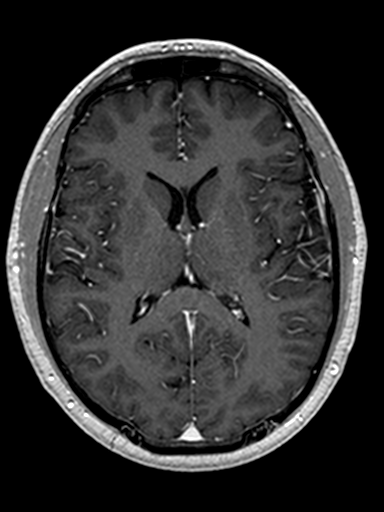
[im 107/160]
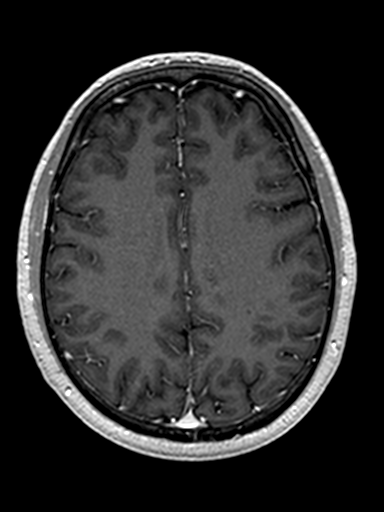

[Series 14: T1 post-contrast · coronal · 5.0mm · 0.45mm/px · 2 of 29 slices shown]
[im 1/29]
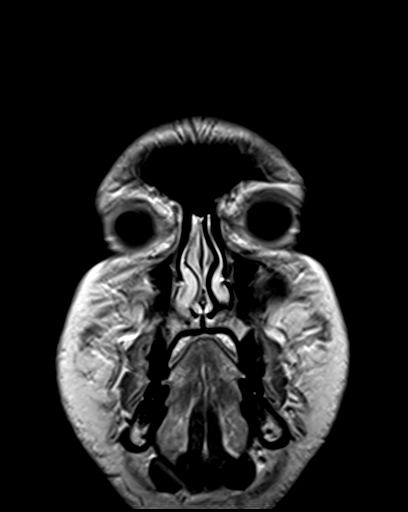
[im 29/29]
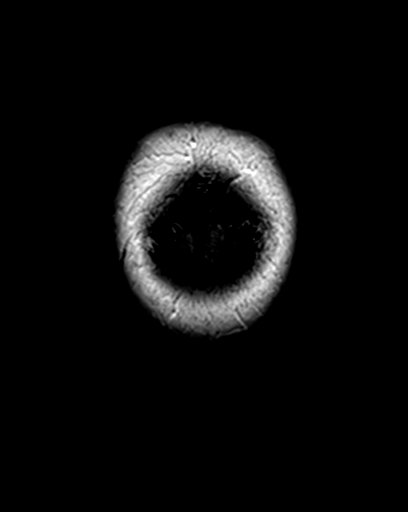

[41 of 48 positions shown; findings below may reference images not displayed]

FINDINGS: MRI HEAD FINDINGS

Brain: No white matter disease, infarction, hemorrhage,
hydrocephalus, extra-axial collection or mass lesion.

Vascular: Normal flow voids and vascular enhancements

Skull and upper cervical spine: Normal marrow signal

MRI ORBITS FINDINGS

Orbits: No traumatic, tumoral, or inflammatory finding. Globes,
optic nerves, orbital fat, extraocular muscles, vascular structures,
and lacrimal glands are normal.

Visualized sinuses: Clear

Soft tissues: Negative
IMPRESSION: Normal MRI of the brain and orbits.

## 2021-10-31 MED ORDER — GADOBENATE DIMEGLUMINE 529 MG/ML IV SOLN
15.0000 mL | Freq: Once | INTRAVENOUS | Status: AC | PRN
  Administered 2021-10-31: 16:00:00 15 mL via INTRAVENOUS

## 2021-11-10 ENCOUNTER — Encounter: Payer: Self-pay | Admitting: Neurology

## 2021-11-10 ENCOUNTER — Other Ambulatory Visit: Payer: Self-pay

## 2021-11-10 NOTE — Telephone Encounter (Signed)
F/u  I called [facility] and spoke with Lauren at [phone].  The patient has Pharmacy # [id number]  Nurtec is approved from 23.1.2023 to 3.1.2023 16 pills for 30 days   Ajovy medication was denied.  The [facility] rep will be sending over the approval / denied letter for review.   Call the patient and apologies for any inconvenience I explained the process concerning CoverMyMeds and initiate prior authorization over the phone with [facility] on 10/05/21.  The patient verbalized understanding

## 2021-11-10 NOTE — Telephone Encounter (Signed)
We are working on [place]

## 2021-11-14 ENCOUNTER — Telehealth: Payer: Self-pay

## 2021-11-14 NOTE — Telephone Encounter (Signed)
F/u  Alexander Rivera (Key: [id number]) Aimovig 140MG /ML auto-injectors   Form [facility] Electronic Prior Authorization Form (2017 NCPDP) Created 16 minutes ago Sent to Plan 13 minutes ago Plan Response 11 minutes ago Submit Clinical Questions 1 minute ago Determination Wait for Determination Please wait for [facility] 2017 NCPDP to return a determination.

## 2021-11-14 NOTE — Telephone Encounter (Signed)
New message   CoverMyMeds could not find the patient.  I called [facility] at [phone] to initiate prior authorization spoke with Jonette Eva   [facility] is processing your PA request and will respond shortly with next steps. You may close this dialog, return to your dashboard, and perform other tasks. To check for an update later, open this request again from your dashboard.  If you need assistance, please chat with CoverMyMeds or call us at [phone].  Your information has been sent to [facility].  Alexander Rivera (Key: [id number]) Aimovig 140MG /ML auto-injectors   Form [facility] Electronic Prior Authorization Form (2017 NCPDP) Created 16 minutes ago Sent to Plan 13 minutes ago Plan Response 11 minutes ago Submit Clinical Questions 1 minute ago Determination Wait for Determination Please wait for [facility] 2017 NCPDP to return a determination.

## 2022-02-12 ENCOUNTER — Encounter: Payer: Self-pay | Admitting: Family Medicine

## 2022-02-12 ENCOUNTER — Ambulatory Visit: Payer: Managed Care, Other (non HMO) | Admitting: Family Medicine

## 2022-02-12 VITALS — BP 122/78 | HR 80 | Temp 98.1°F | Ht 67.0 in | Wt 156.4 lb

## 2022-02-12 DIAGNOSIS — Z91018 Allergy to other foods: Secondary | ICD-10-CM | POA: Diagnosis not present

## 2022-02-12 MED ORDER — METHYLPREDNISOLONE 4 MG PO TBPK: ORAL_TABLET | ORAL | 0 refills | Status: DC

## 2022-02-12 NOTE — Progress Notes (Signed)
Chief Complaint  ?Patient presents with  ? Rash  ?  Dry, itchy rash on his face since last Tuesday. Also on it genital area. Thinks it might be related to eating pepper or mango.Used cetaphil cream on it last night to take the dryness out of it.   ? ?Patient presents with complaint of rash on face, and itching in genitals, which he thinks may be related to a food allergy. ? ?On the morning of 4/4 he had some bread with peppers in it.   He noticed slight tingling/itchy on the lips. Shortly after.  He reports that he has had a problem with certain peppers, and poppy seeds, causing similar lip tingling in the past.  Later that day, afer eating mango, the tingling around the mouth got worse, and that's when he noticed rash on the face, and itchy genitals. ? ?At that time he started taking Benadryl qHS and zyrtec during the day. ?He didn't take either medication on Fri/Sat while working 12 hour shifts.  He didn't notice any worsening over the weekend. ?He took benadryl last night, zyrtec this morning. ?Penis was very red and itchy last night, better now ? ?No yardwork, hiking ?No new products, contacts. ?Started Lyrica Friday from ortho, no other new medications. ? ?He has had similar reactions to foods in the past, but this time it is slowly getting worse rather than improving (spreading to around upper eyelids). ? ?He recalls reactions in the past to certain spicy peppers (causing blisters to form on his buttocks), and Everything bagel spice.  He has also reacted to capsaicin cream. ? ?Once after eating spicy food at an [place] he developed itchy ears, shortness of breath. He went to the ER, where he was treated with IV benadryl and steroid injection ? ?Never saw allergist. ?Usually mild itching, lasts for a week and resolves. ? ?PMH, PSH, SH reviewed ? ?Outpatient Encounter Medications as of 02/12/2022  ?Medication Sig Note  ? cetirizine (ZYRTEC) 10 MG tablet Take 10 mg by mouth daily. 02/12/2022: Took this  am ?  ? diphenhydrAMINE (BENADRYL) 25 MG tablet Take 25 mg by mouth every 6 (six) hours as needed. 02/12/2022: Last dose last night  ? methylPREDNISolone (MEDROL DOSEPAK) 4 MG TBPK tablet Take as directed   ? pregabalin (LYRICA) 75 MG capsule Take by mouth.   ? Rimegepant Sulfate (NURTEC) 75 MG TBDP Take 75 mg by mouth daily as needed. (Patient not taking: Reported on 09/20/2021)   ? [DISCONTINUED] baclofen (LIORESAL) 20 MG tablet Take 20 mg by mouth 3 (three) times daily as needed.   ? [DISCONTINUED] diazepam (VALIUM) 5 MG tablet Take 5 mg by mouth as directed.   ? [DISCONTINUED] Fremanezumab-vfrm (AJOVY) 225 MG/1.5ML SOAJ Inject into the skin. 09/20/2021: First injection 09/13/21 (sample)  ? [DISCONTINUED] nortriptyline (PAMELOR) 25 MG capsule Take 1 capsule at bedtime for one week, then stop   ? ?No facility-administered encounter medications on file as of 02/12/2022.  ? ?NOT taking medrol dosepak prior to today's visit. ? ?No Known Allergies ? ?ROS: no fever, chills, URI symptoms.  No cough, shortness of breath, chest pain.  No n/v/d.   ?+itchy rash on face (eyelids, L face), tingling of lips.  +intermittent severe itching of genitalia (currently mild). ? ? ?PHYSICAL EXAM: ? ?BP 122/78   Pulse 80   Temp 98.1 ?F (36.7 ?C) (Tympanic)   Ht '5\' 7"'$  (1.702 m)   Wt 156 lb 6.4 oz (70.9 kg)   BMI 24.50 kg/m?  ? ?  Pleasant male, accompanied by his wife. ?He is in no acute distress ?HEENT: conjunctiva and sclera are clear, EOMI. No nasal drainage. OP is clear without erythema, swelling, tongue is normal. ?Lips do not appear swollen. ?There is mild erythema of both eyelids, with a curivlinear area of discoloration/erythema extending from the left medial nose, down below the eye, and then up towards the temple on the left ?Dry patch is present at lower R lip ?Neck: no lymphadenopathy, thyromegaly or mass ?Heart: regular rate and rhythm, no murmur ?Lungs: clear, no wheezes, rales, ronchi ?Extremities: no edema ?GU: Minimal  erythema on penis, scrotum. No other abnormalities noted. ?Back: no spinal or CVA tenderness.  Mild diffuse acne lesions noted on back. ?Psych: normal mood, affect, hygiene and grooming ?Neuro: alert and oriented, normal strength, gait. ? ? ?ASSESSMENT/PLAN: ? 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 Pepcid BID.  If cont to spread/worsen, start steroids. Has epi-pen avail at home if needed - Plan: Ambulatory referral to Allergy, methylPREDNISolone (MEDROL DOSEPAK) 4 MG TBPK tablet ? ?Worsening of symptoms last night related to not taking antihistamines through the weekend.  Doesn't seem to be progressing to anaphylaxis, just worsening of rash.  Will try intensifying antihistamines (H1 and H2 blockers), with HC BID prn for itching.  If not improving, will start steroid course. ?Has epi-pen at home (wife's), will use if any anaphylaxis.  Understands need to go to ER if uses epi-pen. ?Will refer to allergist given ongoing issues with foods. ? ?I spent 36 minutes dedicated to the care of this patient, including pre-visit review of records, face to face time, post-visit ordering of testing and documentation. ? ?Take the zyrtec up to twice daily. ?Use the benadryl if needed for breakthrough itching. ?Increase the pepcid to twice daily. ?You may use over-the-counter 1% hydrocortisone cream twice daily to the face (only very sparingly, if truly needed, for genitals). ? ?If you aren't noticing improvement, and especially if you continued spread, or any shortness of breath, mouth or throat swelling, then start the steroid course. ?Have the epi-pen on hand if you develop trouble breathing. ?Go to the ER if you have to use the epi-pen. ? ?We are putting in a referral to an allergist.  ? ? ?

## 2022-02-12 NOTE — Patient Instructions (Signed)
Take the zyrtec up to twice daily. ?Use the benadryl if needed for breakthrough itching. ?Increase the pepcid to twice daily. ?You may use over-the-counter 1% hydrocortisone cream twice daily to the face (only very sparingly, if truly needed, for genitals). ? ?If you aren't noticing improvement, and especially if you continued spread, or any shortness of breath, mouth or throat swelling, then start the steroid course. ?Have the epi-pen on hand if you develop trouble breathing. ?Go to the ER if you have to use the epi-pen. ? ?We are putting in a referral to an allergist.  ?

## 2022-02-14 ENCOUNTER — Encounter: Payer: Self-pay | Admitting: Family Medicine

## 2022-02-20 NOTE — Patient Instructions (Addendum)
?  HEALTH MAINTENANCE RECOMMENDATIONS: ? ?It is recommended that you get at least 30 minutes of aerobic exercise at least 5 days/week (for weight loss, you may need as much as 60-90 minutes). This can be any activity that gets your heart rate up. This can be divided in 10-15 minute intervals if needed, but try and build up your endurance at least once a week.  Weight bearing exercise is also recommended twice weekly. ? ?Eat a healthy diet with lots of vegetables, fruits and fiber.  "Colorful" foods have a lot of vitamins (ie green vegetables, tomatoes, red peppers, etc).  Limit sweet tea, regular sodas and alcoholic beverages, all of which has a lot of calories and sugar.  Up to 2 alcoholic drinks daily may be beneficial for men (unless trying to lose weight, watch sugars).  Drink a lot of water. ? ?Sunscreen of at least SPF 30 should be used on all sun-exposed parts of the skin when outside between the hours of 10 am and 4 pm (not just when at beach or pool, but even with exercise, golf, tennis, and yard work!)  Use a sunscreen that says "broad spectrum" so it covers both UVA and UVB rays, and make sure to reapply every 1-2 hours. ? ?Remember to change the batteries in your smoke detectors when changing your clock times in the spring and fall.  Carbon monoxide detectors are recommended for your home. ? ?Use your seat belt every time you are in a car, and please drive safely and not be distracted with cell phones and texting while driving. ? ?Please schedule a routine dental exam. ? ?

## 2022-02-20 NOTE — Progress Notes (Signed)
Chief Complaint  ?Patient presents with  ? Annual Exam  ?  Nonfasting annual exam. No concers. Rash is totally resolved-feels like he isn't sleeping well from the steroid use. He believes he had 2 varicella shots, only one in [place]. Has not had Tdap and will take today.   ? ? ?Alexander Rivera is a 36 y.o. male who presents for a complete physical.   ?He has the following concerns: ? ?He reports some decreased libido for the last couple of years. No erectile problems.  Some fatigue, "my whole life". ?No stress, no kids, date nights every week. ?Tried couple's therapy in the past for this. ?Has been since chemo >10 years ago. ? ?Recently seen with rash on face, itching of genitals, felt to have food allergy.  Rash worsened despite antihistamines (H1 and H2 blockers), so was treated with course of oral steroids. It got a little worse the first couple of days, but ultimately completely resolved.  He has been off the steroids x 2-3 days without any recurrence. ?He has appt 5/12 with allergist. ? ?Migraines--under the care of neuro.  They were trying to start him on Ajovy (had only 1 dose/sample) or Aimovig, but had trouble with insurance.  He reports he is managing his migraines well with PT, and use of Nurtec prn as rescue med. Hasn't had a migraine in a while.   ?Drinking 5 bottles of water (12 oz?) daily has really helped. ?Used ibuprofen regularly for a while, though not daily currently (recently cut back).  Uses tylenol a couple of times/week. ? ?Seen in 09/2021 with tachycardia, chest pain/flutters.  Labs were normal (c-met, cbc, TSH). ?CXR was normal.  He was tapered off nortriptylene, and symptoms resolved. Never saw cardiologist (was referred), due to it resolving. ? ?Denies heartburn, but has chronic/frequent "upset stomach"--thinks related to ibuprofen use.  He takes pepcid (OTC) once daily, which helps. ? ?He reports seeing ortho (Dr. 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  Failed  injection.  Doing home PT exercise program as recommended by ortho.  Has pain turning head to the right.  He was started on Lyrica 2 weeks ago--around the time he also had the rash.  Took steroids, so unsure which is helping the neck.  Denies neck pain currently. Denies sedation/SE from the lyrica (but also has been taking extra zyrtec, prednisone). ? ? ?Immunization History  ?Administered Date(s) Administered  ? DTaP 04/09/1985, 06/11/1985, 10/16/1985, 05/31/1987, 06/25/1990  ? Hepatitis A 05/25/2009, 06/27/2009  ? Hepatitis B 04/29/2009, 05/25/2009, 06/27/2009  ? IPV 04/09/1985, 05/31/1987, 06/06/1990  ? Influenza Split 08/28/2011  ? Influenza-Unspecified 12/06/2018, 09/09/2021  ? MMR 02/23/1987, 05/21/2011  ? PFIZER(Purple Top)SARS-COV-2 Vaccination 12/10/2019, 12/31/2019, 08/31/2020  ? Pension scheme manager 8yrs & up 08/24/2021  ? Td 08/25/1999  ? Tdap 04/29/2009  ? Varicella 06/22/1996  ? ?Last colonoscopy: never ?Last PSA: never ?Dentist: past due (dad was his Pharmacist, community, retired) ?Ophtho: yearly, has glasses ?Exercise: Walks in the neighborhood with his wife, at least 3-4 days/week x 30-40 mins. ?Uses resistance bands up to 2x/week. 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 ? ?PMH, PSH, SH and FH were reviewed and updated ?FH--father has defibrillator/pacemaker (late 50s-60). ? ?Outpatient Encounter Medications as of 02/21/2022  ?Medication Sig Note  ? famotidine (PEPCID) 20 MG tablet Take 20 mg by mouth 2 (two) times daily.   ? Multiple Vitamin (MULTI VITAMIN MENS PO) Take 1 capsule by mouth daily.   ? pregabalin (LYRICA) 75  MG capsule Take by mouth.   ? Rimegepant Sulfate (NURTEC) 75 MG TBDP Take 75 mg by mouth daily as needed. (Patient not taking: Reported on 09/20/2021)   ? [DISCONTINUED] cetirizine (ZYRTEC) 10 MG tablet Take 10 mg by mouth daily. 02/12/2022: Took this am ?  ? [DISCONTINUED] diphenhydrAMINE (BENADRYL) 25 MG tablet Take 25 mg by mouth every 6 (six) hours as needed. 02/12/2022: Last  dose last night  ? [DISCONTINUED] methylPREDNISolone (MEDROL DOSEPAK) 4 MG TBPK tablet Take as directed   ? ?No facility-administered encounter medications on file as of 02/21/2022.  ? ?No Known Allergies ? ?ROS:  The patient denies anorexia, fever, weight changes, vision loss, decreased hearing, ear pain, hoarseness, chest pain, palpitations, dizziness, syncope, dyspnea on exertion, cough, edema, nausea, vomiting, diarrhea, constipation, melena, hematochezia, indigestion/heartburn, hematuria, incontinence, erectile dysfunction, nocturia, weakened urine stream, dysuria, genital lesions, joint pains, weakness, tremor, suspicious skin lesions, depression, anxiety, abnormal bleeding/bruising, or enlarged lymph nodes. ?Headaches per HPI, much improved. ?Rash resolved. ?Tachycardia resolved upon stopping nortriptyline  ?R neck pain and tingling in RUE has resolved (with PT exercises).  ?Numbness in toes resolved. ?Some recent insomnia related to steroids.  Had some insomnia prior as well, lyrica helps. ?Frequent abdominal discomfort, r/b taking pepcid daily. Denies heartburn, dysphagia. ? ? ?PHYSICAL EXAM: ? ?BP 124/70   Pulse 72   Ht $R'5\' 7"'qF$  (1.702 m)   Wt 161 lb 3.2 oz (73.1 kg)   BMI 25.25 kg/m?  ?Pulse 98 when examined by MD ? ?Wt Readings from Last 3 Encounters:  ?02/21/22 161 lb 3.2 oz (73.1 kg)  ?02/12/22 156 lb 6.4 oz (70.9 kg)  ?09/20/21 157 lb 12.8 oz (71.6 kg)  ? ?General Appearance:    Alert, cooperative, no distress, appears stated age  ?Head:    Normocephalic, without obvious abnormality, atraumatic  ?Eyes:    PERRL, conjunctiva/corneas clear, EOM's intact, fundi  ?  benign  ?Ears:    Normal TM's and external ear canals  ?Nose:   Nares normal, mucosa normal, no drainage or sinus   tenderness  ?Throat:   Lips, mucosa, and tongue normal; teeth and gums normal  ?Neck:   Supple, no lymphadenopathy;  thyroid:  no enlargement/ tenderness/nodules; no carotid bruit or JVD  ?Back:    Spine nontender, no curvature,  ROM normal, no CVA     tenderness  ?Lungs:     Clear to auscultation bilaterally without wheezes, rales or     ronchi; respirations unlabored  ?Chest Wall:    No tenderness. WHSS  ? Heart:    Regular rate (borderline tachycardic during MD exam, was slower when vitals were checked by nurse) and rhythm, S1 and S2 normal, no murmur, rub or gallop  ?Breast Exam:    No chest wall tenderness, masses or gynecomastia  ?Abdomen:     Soft, non-tender, nondistended, normoactive bowel sounds,  ?  no masses, no hepatosplenomegaly  ?Genitalia:    Normal male external genitalia without lesions, circumcised.  Testicles without masses.  No inguinal hernias.  ?Rectal:   Deferred due to age <40 and lack of symptoms  ?Extremities:   No clubbing, cyanosis or edema  ?Pulses:   2+ and symmetric all extremities  ?Skin:   Skin color, texture, turgor normal, no rashes or lesions. Face is clear.  ?Lymph nodes:   Cervical, supraclavicular, and inguinal nodes normal  ?Neurologic:   CNII-XII intact, normal strength, sensation and gait; reflexes 2+ and symmetric throughout  ?  Psych:   Normal mood, affect, hygiene and grooming.    ? ? ?ASSESSMENT/PLAN: ? ?Annual physical exam - Plan: POCT Urinalysis DIP (Proadvantage Device), Hepatitis C antibody, Lipid panel, Comprehensive metabolic panel, Varicella zoster antibody, IgG, Testosterone ? ?Decreased libido - DDx discussed. Unclear etiology, since his chemo. Check testosterone.  No related to meds, stress/relationship issues - Plan: Testosterone ? ?Medication monitoring encounter - Plan: Comprehensive metabolic panel ? ?Need for tetanus booster - Plan: Tdap vaccine greater than or equal to 7yo IM ? ?Screening for viral disease - has only had 1 varicella vaccine.  Screen for immunity. no h/o varicella illness - Plan: Varicella zoster antibody, IgG ? ?Will return for fasting labs--Lipid, c-met, hep C screen, varicella titer, testosterone level ? ? ?Recommended at least 30 minutes of aerobic  activity at least 5 days/week; proper sunscreen use reviewed; healthy diet and alcohol recommendations (less than or equal to 2 drinks/day) reviewed; regular seatbelt use; changing batteries in smoke detectors. Self-t

## 2022-02-21 ENCOUNTER — Ambulatory Visit: Payer: Managed Care, Other (non HMO) | Admitting: Family Medicine

## 2022-02-21 ENCOUNTER — Encounter: Payer: Self-pay | Admitting: Family Medicine

## 2022-02-21 VITALS — BP 124/70 | HR 72 | Ht 67.0 in | Wt 161.2 lb

## 2022-02-21 DIAGNOSIS — R6882 Decreased libido: Secondary | ICD-10-CM | POA: Diagnosis not present

## 2022-02-21 DIAGNOSIS — Z0000 Encounter for general adult medical examination without abnormal findings: Secondary | ICD-10-CM

## 2022-02-21 DIAGNOSIS — Z23 Encounter for immunization: Secondary | ICD-10-CM

## 2022-02-21 DIAGNOSIS — Z5181 Encounter for therapeutic drug level monitoring: Secondary | ICD-10-CM

## 2022-02-21 DIAGNOSIS — Z1159 Encounter for screening for other viral diseases: Secondary | ICD-10-CM

## 2022-02-21 LAB — POCT URINALYSIS DIP (PROADVANTAGE DEVICE)
Bilirubin, UA: NEGATIVE
Blood, UA: NEGATIVE
Glucose, UA: NEGATIVE mg/dL
Ketones, POC UA: NEGATIVE mg/dL
Leukocytes, UA: NEGATIVE
Nitrite, UA: NEGATIVE
Protein Ur, POC: NEGATIVE mg/dL
Specific Gravity, Urine: 1.01
Urobilinogen, Ur: NEGATIVE
pH, UA: 7 (ref 5.0–8.0)

## 2022-02-27 ENCOUNTER — Other Ambulatory Visit: Payer: Managed Care, Other (non HMO)

## 2022-03-06 ENCOUNTER — Other Ambulatory Visit: Payer: Managed Care, Other (non HMO)

## 2022-03-06 DIAGNOSIS — R6882 Decreased libido: Secondary | ICD-10-CM

## 2022-03-06 DIAGNOSIS — Z0000 Encounter for general adult medical examination without abnormal findings: Secondary | ICD-10-CM

## 2022-03-06 DIAGNOSIS — Z5181 Encounter for therapeutic drug level monitoring: Secondary | ICD-10-CM

## 2022-03-06 DIAGNOSIS — Z1159 Encounter for screening for other viral diseases: Secondary | ICD-10-CM

## 2022-03-07 LAB — COMPREHENSIVE METABOLIC PANEL
ALT: 36 IU/L (ref 0–44)
AST: 29 IU/L (ref 0–40)
Albumin/Globulin Ratio: 1.6 (ref 1.2–2.2)
Albumin: 4.4 g/dL (ref 4.0–5.0)
Alkaline Phosphatase: 72 IU/L (ref 44–121)
BUN/Creatinine Ratio: 11 (ref 9–20)
BUN: 12 mg/dL (ref 6–20)
Bilirubin Total: 0.5 mg/dL (ref 0.0–1.2)
CO2: 25 mmol/L (ref 20–29)
Calcium: 9.5 mg/dL (ref 8.7–10.2)
Chloride: 101 mmol/L (ref 96–106)
Creatinine, Ser: 1.07 mg/dL (ref 0.76–1.27)
Globulin, Total: 2.8 g/dL (ref 1.5–4.5)
Glucose: 92 mg/dL (ref 70–99)
Potassium: 4.6 mmol/L (ref 3.5–5.2)
Sodium: 139 mmol/L (ref 134–144)
Total Protein: 7.2 g/dL (ref 6.0–8.5)
eGFR: 92 mL/min/{1.73_m2} (ref >59)

## 2022-03-07 LAB — LIPID PANEL
Chol/HDL Ratio: 3.5 ratio (ref 0.0–5.0)
Cholesterol, Total: 218 mg/dL — ABNORMAL HIGH (ref 100–199)
HDL: 62 mg/dL (ref >39)
LDL Chol Calc (NIH): 134 mg/dL — ABNORMAL HIGH (ref 0–99)
Triglycerides: 124 mg/dL (ref 0–149)
VLDL Cholesterol Cal: 22 mg/dL (ref 5–40)

## 2022-03-07 LAB — VARICELLA ZOSTER ANTIBODY, IGG: Varicella zoster IgG: <135 index — ABNORMAL LOW (ref >165)

## 2022-03-07 LAB — TESTOSTERONE: Testosterone: 474 ng/dL (ref 264–916)

## 2022-03-07 LAB — HEPATITIS C ANTIBODY: Hep C Virus Ab: NONREACTIVE

## 2022-03-16 ENCOUNTER — Ambulatory Visit: Payer: Managed Care, Other (non HMO) | Admitting: Internal Medicine

## 2022-04-25 ENCOUNTER — Ambulatory Visit: Payer: Managed Care, Other (non HMO) | Admitting: Neurology

## 2022-04-26 ENCOUNTER — Encounter: Payer: Self-pay | Admitting: Allergy & Immunology

## 2022-04-26 ENCOUNTER — Ambulatory Visit: Payer: Managed Care, Other (non HMO) | Admitting: Allergy & Immunology

## 2022-04-26 VITALS — BP 112/62 | HR 87 | Temp 97.6°F | Resp 12 | Ht 67.0 in | Wt 162.2 lb

## 2022-04-26 DIAGNOSIS — J310 Chronic rhinitis: Secondary | ICD-10-CM | POA: Diagnosis not present

## 2022-04-26 DIAGNOSIS — T63481D Toxic effect of venom of other arthropod, accidental (unintentional), subsequent encounter: Secondary | ICD-10-CM

## 2022-04-26 DIAGNOSIS — T7800XD Anaphylactic reaction due to unspecified food, subsequent encounter: Secondary | ICD-10-CM

## 2022-04-26 MED ORDER — EPINEPHRINE 0.3 MG/0.3ML IJ SOAJ: 0.3000 mg | INTRAMUSCULAR | 2 refills | Status: AC | PRN

## 2022-04-26 NOTE — Patient Instructions (Addendum)
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  - We will call you in 1-2 weeks with the results of the testing. - EpiPen training provided out of an abundance of caution. - Anaphylaxis management plan provided. - Call us if the EpiPen is too expensive.  - Continue to note triggers so we can try to figure this out.   2. Chronic rhinitis - We are going to get environmental allergy testing via the blood. - We will call you in 1-2 weeks with the results of the testing.   3. Insect sting allergy - We are getting a stinging insect panel. - We will call you in 1-2 weeks with the results of the testing.  4. Return in about 3 months (around 07/27/2022).    Please inform us of any Emergency Department visits, hospitalizations, or changes in symptoms. Call us before going to the ED for breathing or allergy symptoms since we might be able to fit you in for a sick visit. Feel free to contact us anytime with any questions, problems, or concerns.  It was a pleasure to meet you and Cyndi today!  Websites that have reliable patient information: 1. American Academy of Asthma, Allergy, and Immunology: www.aaaai.org 2. Food Allergy Research and Education (FARE): foodallergy.org 3. Mothers of Asthmatics: http://www.asthmacommunitynetwork.org 4. American College of Allergy, Asthma, and Immunology: www.acaai.org   COVID-19 Vaccine Information can be found at: ShippingScam.co.uk For questions related to vaccine distribution or appointments, please email [email or web address] or call [phone].   We realize that you might be concerned about having an allergic reaction to the COVID19 vaccines. To help with that concern, WE ARE OFFERING THE COVID19 VACCINES IN OUR OFFICE! Ask the front desk for dates!     "Like" [place] on Facebook and Instagram for our latest  updates!      A healthy democracy works best when [employer] participate! Make sure you are registered to vote! If you have moved or changed any of your contact information, you will need to get this updated before voting!  In some cases, you MAY be able to register to vote online: [email or web address]

## 2022-04-30 LAB — ALLERGENS W/COMP RFLX AREA 2
Alternaria Alternata IgE: <0.1 kU/L
Aspergillus Fumigatus IgE: <0.1 kU/L
Bermuda Grass IgE: <0.1 kU/L
Cedar, Mountain IgE: <0.1 kU/L
Cladosporium Herbarum IgE: <0.1 kU/L
Cockroach, German IgE: <0.1 kU/L
Common Silver Birch IgE: <0.1 kU/L
Cottonwood IgE: <0.1 kU/L
D Farinae IgE: 0.75 kU/L — AB
D Pteronyssinus IgE: 0.34 kU/L — AB
E001-IgE Cat Dander: <0.1 kU/L
E005-IgE Dog Dander: <0.1 kU/L
Elm, American IgE: <0.1 kU/L
IgE (Immunoglobulin E), Serum: 56 IU/mL (ref 6–495)
Johnson Grass IgE: <0.1 kU/L
Maple/Box Elder IgE: 0.13 kU/L — AB
Mouse Urine IgE: <0.1 kU/L
Oak, White IgE: <0.1 kU/L
Pecan, Hickory IgE: <0.1 kU/L
Penicillium Chrysogen IgE: <0.1 kU/L
Pigweed, Rough IgE: <0.1 kU/L
Ragweed, Short IgE: <0.1 kU/L
Sheep Sorrel IgE Qn: <0.1 kU/L
Timothy Grass IgE: <0.1 kU/L
White Mulberry IgE: <0.1 kU/L

## 2022-04-30 LAB — ALPHA-GAL PANEL
Allergen Lamb IgE: <0.1 kU/L
Beef IgE: <0.1 kU/L
IgE (Immunoglobulin E), Serum: 58 IU/mL (ref 6–495)
O215-IgE Alpha-Gal: <0.1 kU/L
Pork IgE: <0.1 kU/L

## 2022-04-30 LAB — ALLERGEN STINGING INSECT PANEL
Honeybee IgE: <0.1 kU/L
Hornet, White Face, IgE: 0.2 kU/L — AB
Hornet, Yellow, IgE: <0.1 kU/L
Paper Wasp IgE: <0.1 kU/L
Yellow Jacket, IgE: 0.18 kU/L — AB

## 2022-04-30 LAB — ALLERGEN, BLACK PEPPER,F280: Allergen Black Pepper IgE: <0.1 kU/L

## 2022-04-30 LAB — ALLERGEN, MANGO, F91: Mango IgE: <0.1 kU/L

## 2022-04-30 LAB — F224-IGE POPPY SEED: F224-IgE Poppy Seed: <0.1 kU/L

## 2022-04-30 LAB — ALLERGEN,GRN PEPPER,PAPRIKA,F218: Paprika IgE: <0.1 kU/L

## 2022-04-30 LAB — TRYPTASE: Tryptase: 3.1 ug/L (ref 2.2–13.2)

## 2022-04-30 LAB — ALLERGEN,CHILI PEPPER,RF279: F279-IgE Chili Pepper: <0.1 kU/L

## 2022-07-11 ENCOUNTER — Encounter: Payer: Self-pay | Admitting: Internal Medicine

## 2022-07-31 ENCOUNTER — Ambulatory Visit: Payer: Managed Care, Other (non HMO) | Admitting: Allergy & Immunology

## 2022-08-14 ENCOUNTER — Encounter: Payer: Self-pay | Admitting: Internal Medicine

## 2023-02-26 NOTE — Patient Instructions (Incomplete)
  HEALTH MAINTENANCE RECOMMENDATIONS:  It is recommended that you get at least 30 minutes of aerobic exercise at least 5 days/week (for weight loss, you may need as much as 60-90 minutes). This can be any activity that gets your heart rate up. This can be divided in 10-15 minute intervals if needed, but try and build up your endurance at least once a week.  Weight bearing exercise is also recommended twice weekly.  Eat a healthy diet with lots of vegetables, fruits and fiber.  "Colorful" foods have a lot of vitamins (ie green vegetables, tomatoes, red peppers, etc).  Limit sweet tea, regular sodas and alcoholic beverages, all of which has a lot of calories and sugar.  Up to 2 alcoholic drinks daily may be beneficial for men (unless trying to lose weight, watch sugars).  Drink a lot of water.  Sunscreen of at least SPF 30 should be used on all sun-exposed parts of the skin when outside between the hours of 10 am and 4 pm (not just when at beach or pool, but even with exercise, golf, tennis, and yard work!)  Use a sunscreen that says "broad spectrum" so it covers both UVA and UVB rays, and make sure to reapply every 1-2 hours.  Remember to change the batteries in your smoke detectors when changing your clock times in the spring and fall.  Carbon monoxide detectors are recommended for your home.  Use your seat belt every time you are in a car, and please drive safely and not be distracted with cell phones and texting while driving.   Yearly flu shots are recommended. COVID booster is recommended.  You were not immune to chicken pox on blood testing last year.  Varicella vaccines (2 doses, given a month apart) is recommended.

## 2023-02-26 NOTE — Progress Notes (Unsigned)
No chief complaint on file.   Alexander Rivera is a 37 y.o. male who presents for a complete physical.   He has the following concerns:  He has had decreased libido for the last couple of years. No erectile problems.  Some fatigue, "my whole life", since chemo >10 years ago. No stress, no kids, date nights every week. Tried couple's therapy in the past for this. He had normal testosterone level last year.  Possible food allergy last year.  He was sent to allergist for evaluation.  Lab testing (not skin) in 04/2022 revealed +allergy tests for hornet, yellow jacket, dust mites, trees. Neg alpha gal panel, mango, paprika, black and chili peppers, tryptase negative (r/o mast cell dz)  Migraines--under the care of neuro.  Last appt with Dr. Everlena Cooper for 04/2022 was cancelled, has no f/u scheduled.  Last year they were trying to start him on Ajovy (had only 1 dose/sample) or Aimovig, but had trouble with insurance.  He reported managing his migraines well with PT, and use of Nurtec prn as rescue med. Hasn't had a migraine in a while.  Drinking more water helped a lot. UPDATE  Previously reported chronic/frequent "upset stomach"--thinks related to ibuprofen use.  He takes pepcid (OTC) once daily, which helps. Denies heartburn UPDATE  He has seen ortho (Dr. Lucie Leather at [facility]) for a bulging disk in his neck.  Failed injection.  Doing home PT exercise program as recommended by ortho.  Has pain turning head to the right.  He was started on Lyrica 2 weeks ago--around the time he also had the rash.  Took steroids, so unsure which is helping the neck.  Denies neck pain currently. Denies sedation/SE from the lyrica (but also has been taking extra zyrtec, prednisone). UPDATE   Immunization History  Administered Date(s) Administered   DTaP 04/09/1985, 06/11/1985, 10/16/1985, 05/31/1987, 06/25/1990   Hepatitis A 05/25/2009, 06/27/2009   Hepatitis B 04/29/2009, 05/25/2009, 06/27/2009   IPV 04/09/1985,  05/31/1987, 06/06/1990   Influenza Split 08/28/2011   Influenza-Unspecified 12/06/2018, 09/09/2021   MMR 02/23/1987, 05/21/2011   PFIZER(Purple Top)SARS-COV-2 Vaccination 12/10/2019, 12/31/2019, 08/31/2020   Pfizer Covid-19 Vaccine Bivalent Booster 41yrs & up 08/24/2021   Td 08/25/1999   Tdap 04/29/2009, 02/21/2022   Varicella 06/22/1996   He was noted to have negative varicella titers in 2023, recommended varicella vaccine x2. He did not schedule NV for this as recommended. Last colonoscopy: never Last PSA: never Dentist: past due (dad was his dentist, retired) Ophtho: yearly, has glasses Exercise:  Walks in the neighborhood with his wife, at least 3-4 days/week x 30-40 mins. Uses resistance bands up to 2x/week. 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  PMH, PSH, SH and FH were reviewed and updated FH--father has defibrillator/pacemaker (late 50s-60).    ROS:  The patient denies anorexia, fever, weight changes, vision loss, decreased hearing, ear pain, hoarseness, chest pain, palpitations, dizziness, syncope, dyspnea on exertion, cough, edema, nausea, vomiting, diarrhea, constipation, melena, hematochezia, indigestion/heartburn, hematuria, incontinence, erectile dysfunction, nocturia, weakened urine stream, dysuria, genital lesions, joint pains, weakness, tremor, suspicious skin lesions, depression, anxiety, abnormal bleeding/bruising, or enlarged lymph nodes. Headaches per HPI, much improved. No further rashes No recurrent tachycardia (occurred in past when on nortriptyline) R neck pain and tingling in RUE has resolved (with PT exercises).  Insomnia? Frequent abdominal discomfort, r/b taking pepcid daily. Denies heartburn, dysphagia.   PHYSICAL EXAM:  There were no vitals taken for this visit.  Wt Readings from Last 3 Encounters:  04/26/22 162 lb 3.2  oz (73.6 kg)  02/21/22 161 lb 3.2 oz (73.1 kg)  02/12/22 156 lb 6.4 oz (70.9 kg)   General Appearance:    Alert, cooperative, no  distress, appears stated age  Head:    Normocephalic, without obvious abnormality, atraumatic  Eyes:    PERRL, conjunctiva/corneas clear, EOM's intact, fundi    benign  Ears:    Normal TM's and external ear canals  Nose:   Nares normal, mucosa normal, no drainage or sinus   tenderness  Throat:   Lips, mucosa, and tongue normal; teeth and gums normal  Neck:   Supple, no lymphadenopathy;  thyroid:  no enlargement/ tenderness/nodules; no carotid bruit or JVD  Back:    Spine nontender, no curvature, ROM normal, no CVA     tenderness  Lungs:     Clear to auscultation bilaterally without wheezes, rales or     ronchi; respirations unlabored  Chest Wall:    No tenderness. WHSS   Heart:    Regular rate and rhythm, S1 and S2 normal, no murmur, rub or gallop  Breast Exam:    No chest wall tenderness, masses or gynecomastia  Abdomen:     Soft, non-tender, nondistended, normoactive bowel sounds,    no masses, no hepatosplenomegaly  Genitalia:    Normal male external genitalia without lesions, circumcised.  Testicles without masses.  No inguinal hernias.  Rectal:   Deferred due to age <40 and lack of symptoms  Extremities:   No clubbing, cyanosis or edema  Pulses:   2+ and symmetric all extremities  Skin:   Skin color, texture, turgor normal, no rashes or lesions.   Lymph nodes:   Cervical, supraclavicular, and inguinal nodes normal  Neurologic:   CNII-XII intact, normal strength, sensation and gait; reflexes 2+ and symmetric throughout          Psych:   Normal mood, affect, hygiene and grooming.      ASSESSMENT/PLAN:  Varicella #1 NV in 1 month for varicella #2 COVID BOOSTER--recommend in 2 weeks, or decline if pt doesn't want. Did he have a flu shot?  Recommended at least 30 minutes of aerobic activity at least 5 days/week; proper sunscreen use reviewed; healthy diet and alcohol recommendations (less than or equal to 2 drinks/day) reviewed; regular seatbelt use; changing batteries in smoke  detectors. Self-testicular exams. Immunization recommendations discussed--flu shots are recommended yearly.  COVID booster recommended Varicella was non-immune.  *** Colonoscopy recommendations reviewed, age 44.   F/u 1 year, sooner prn.

## 2023-02-27 ENCOUNTER — Ambulatory Visit: Payer: Managed Care, Other (non HMO) | Admitting: Family Medicine

## 2023-02-27 ENCOUNTER — Encounter: Payer: Self-pay | Admitting: Family Medicine

## 2023-02-27 VITALS — BP 128/80 | HR 84 | Ht 67.0 in | Wt 170.4 lb

## 2023-02-27 DIAGNOSIS — Z23 Encounter for immunization: Secondary | ICD-10-CM

## 2023-02-27 DIAGNOSIS — D229 Melanocytic nevi, unspecified: Secondary | ICD-10-CM

## 2023-02-27 DIAGNOSIS — M542 Cervicalgia: Secondary | ICD-10-CM | POA: Diagnosis not present

## 2023-02-27 DIAGNOSIS — G43701 Chronic migraine without aura, not intractable, with status migrainosus: Secondary | ICD-10-CM | POA: Diagnosis not present

## 2023-02-27 DIAGNOSIS — Z0000 Encounter for general adult medical examination without abnormal findings: Secondary | ICD-10-CM | POA: Diagnosis not present

## 2023-02-27 DIAGNOSIS — M62838 Other muscle spasm: Secondary | ICD-10-CM | POA: Diagnosis not present

## 2023-02-27 LAB — COMPREHENSIVE METABOLIC PANEL

## 2023-02-27 LAB — POCT URINALYSIS DIP (PROADVANTAGE DEVICE)
Bilirubin, UA: NEGATIVE
Blood, UA: NEGATIVE
Glucose, UA: NEGATIVE mg/dL
Ketones, POC UA: NEGATIVE mg/dL
Leukocytes, UA: NEGATIVE
Nitrite, UA: NEGATIVE
Protein Ur, POC: NEGATIVE mg/dL
Specific Gravity, Urine: 1.02
Urobilinogen, Ur: 0.2
pH, UA: 6 (ref 5.0–8.0)

## 2023-02-27 LAB — LIPID PANEL

## 2023-02-27 LAB — HEMOGLOBIN A1C

## 2023-02-27 LAB — HEPATITIS B SURFACE ANTIBODY,QUALITATIVE

## 2023-02-27 MED ORDER — METHOCARBAMOL 500 MG PO TABS: 500.0000 mg | ORAL_TABLET | Freq: Three times a day (TID) | ORAL | 0 refills | Status: DC | PRN

## 2023-02-27 MED ORDER — MELOXICAM 15 MG PO TABS: 15.0000 mg | ORAL_TABLET | Freq: Every day | ORAL | 0 refills | Status: DC

## 2023-02-28 LAB — LIPID PANEL
HDL: 55 mg/dL (ref >39)
LDL Chol Calc (NIH): 156 mg/dL — ABNORMAL HIGH (ref 0–99)
Triglycerides: 146 mg/dL (ref 0–149)
VLDL Cholesterol Cal: 26 mg/dL (ref 5–40)

## 2023-02-28 LAB — CBC WITH DIFFERENTIAL/PLATELET
Basophils Absolute: 0 10*3/uL (ref 0.0–0.2)
Basos: 0 %
EOS (ABSOLUTE): 0.1 10*3/uL (ref 0.0–0.4)
Eos: 2 %
Hematocrit: 50.5 % (ref 37.5–51.0)
Hemoglobin: 16.3 g/dL (ref 13.0–17.7)
Immature Grans (Abs): 0 10*3/uL (ref 0.0–0.1)
Immature Granulocytes: 0 %
Lymphocytes Absolute: 2.2 10*3/uL (ref 0.7–3.1)
Lymphs: 38 %
MCH: 28.7 pg (ref 26.6–33.0)
MCHC: 32.3 g/dL (ref 31.5–35.7)
MCV: 89 fL (ref 79–97)
Monocytes Absolute: 0.6 10*3/uL (ref 0.1–0.9)
Monocytes: 10 %
Neutrophils Absolute: 2.9 10*3/uL (ref 1.4–7.0)
Neutrophils: 50 %
Platelets: 217 10*3/uL (ref 150–450)
RBC: 5.68 x10E6/uL (ref 4.14–5.80)
RDW: 13.1 % (ref 11.6–15.4)
WBC: 5.8 10*3/uL (ref 3.4–10.8)

## 2023-02-28 LAB — COMPREHENSIVE METABOLIC PANEL
ALT: 32 IU/L (ref 0–44)
AST: 34 IU/L (ref 0–40)
Albumin/Globulin Ratio: 1.6 (ref 1.2–2.2)
Albumin: 4.8 g/dL (ref 4.1–5.1)
Alkaline Phosphatase: 90 IU/L (ref 44–121)
BUN/Creatinine Ratio: 20 (ref 9–20)
BUN: 19 mg/dL (ref 6–20)
Bilirubin Total: 0.3 mg/dL (ref 0.0–1.2)
Calcium: 9.7 mg/dL (ref 8.7–10.2)
Globulin, Total: 3 g/dL (ref 1.5–4.5)
Potassium: 4.8 mmol/L (ref 3.5–5.2)
Total Protein: 7.8 g/dL (ref 6.0–8.5)

## 2023-02-28 LAB — HEMOGLOBIN A1C: Hgb A1c MFr Bld: 5.1 % (ref 4.8–5.6)

## 2023-04-30 ENCOUNTER — Encounter: Payer: Self-pay | Admitting: Family Medicine

## 2023-04-30 ENCOUNTER — Other Ambulatory Visit: Payer: Self-pay | Admitting: Family Medicine

## 2023-04-30 MED ORDER — PREGABALIN 150 MG PO CAPS: ORAL_CAPSULE | ORAL | 2 refills | Status: DC

## 2023-08-01 ENCOUNTER — Ambulatory Visit: Payer: Managed Care, Other (non HMO) | Admitting: Family Medicine

## 2023-08-01 ENCOUNTER — Encounter: Payer: Self-pay | Admitting: Family Medicine

## 2023-08-01 VITALS — BP 128/76 | HR 96 | Temp 98.2°F | Ht 67.0 in | Wt 161.8 lb

## 2023-08-01 DIAGNOSIS — R051 Acute cough: Secondary | ICD-10-CM

## 2023-08-01 DIAGNOSIS — J309 Allergic rhinitis, unspecified: Secondary | ICD-10-CM | POA: Diagnosis not present

## 2023-08-01 MED ORDER — FLUTICASONE PROPIONATE 50 MCG/ACT NA SUSP
2.0000 | Freq: Every day | NASAL | 6 refills | Status: AC
Start: 2023-08-01 — End: ?

## 2023-08-01 MED ORDER — BENZONATATE 200 MG PO CAPS
200.0000 mg | ORAL_CAPSULE | Freq: Three times a day (TID) | ORAL | 0 refills | Status: DC | PRN
Start: 2023-08-01 — End: 2023-08-21

## 2023-08-01 NOTE — Patient Instructions (Signed)
Drink plenty of water. Start taking Mucinex 12 hour twice daily.  This contains Guaifenesin which is an expectorant, which might help with the cough, even though it isn't a cough suppressant.  Take the benzonatate (tessalon) if needed for cough.  This helps numb the tickle of the cough--it is a DIFFERENT medication than what you took in the Dayquil, so hopefully won't cause side effects.  Start taking flonase (fluticasone), steroid nasal spray, 2 gentle sniffs into each nostril, once daily. This can take up to 2 weeks to have its full effect. Plan on using it for at least a couple of months, if this is helping (treats allergies, but isn't an antihistamine, so hopefully won't cause you any issues with side effects).  Contact us if you develop fever, discolored mucus, chest pain, shortness of breath. You may need to be re-evaluated. Please keep an eye on the color of the phlegm, which gives an indication for treatment (ie if has been clear/white and changes to yellow-green, this info is helpful).

## 2023-08-01 NOTE — Progress Notes (Signed)
Chief Complaint  Patient presents with   Cough    Started Sun 07/21/23 with dry cough (most of department called off 9/16 w/covid). Started taking cough medicine last Tues and cough became more productive this Monday. Continued to take cough medicine but didn't help. Night is the worst, cannot sleep. Does have random coughing fits during the day this week. Stopped cough medicine last Friday.    9/13 people at work were sick (COVID).  He started coughing on Sunday 9/15, got progressively worse. Tuesday cough was much worse, coughing all day/night.  Negative COVID test on 9/17, 9/19, and a third test probably 9/21, all negative.  Had persistent dry cough day/night for the first week. This past Sunday/Monday, cough has become more productive. Head congestion is no different than usual for him--no sinus pain, no discolored nasal drainage. Slight sore throat, comes/goes. More in the morning after coughing at night.  Gets up some phlegm throughout the day, after a coughing fit.  Also has productive cough at night. Hasn't looked at the color. Denies chest congestion, tightness, shortness of breath. Feels like the phlegm is coming from his throat area, not the chest.  Has some chronic nasal congestion. He tried zyrtec for a couple of days, didn't notice a difference.  He took OTC cough medicine for 4-5 days the first week (Dayquil with phenylephrine and DM only), then switched to Zyrtec. Tends to bite/chew his tongue, clench jaws/grind teeth when he takes antihistamines--notes with benadryl, also noted with dayquil (jaw sore the next day).    PMH, PSH, SH reviewed  Outpatient Encounter Medications as of 08/01/2023  Medication Sig Note   cholecalciferol (VITAMIN D3) 25 MCG (1000 UNIT) tablet Take 1,000 Units by mouth daily.    famotidine (PEPCID) 20 MG tablet Take 20 mg by mouth 2 (two) times daily.    Multiple Vitamin (MULTI VITAMIN MENS PO) Take 1 capsule by mouth daily.    pregabalin (LYRICA)  150 MG capsule SMARTSIG:1 Capsule(s) By Mouth Every Evening    EPINEPHrine 0.3 mg/0.3 mL IJ SOAJ injection Inject 0.3 mg into the muscle as needed for anaphylaxis. (Patient not taking: Reported on 02/27/2023) 02/27/2023: As needed   ibuprofen (ADVIL) 200 MG tablet Take 200 mg by mouth every 6 (six) hours as needed. (Patient not taking: Reported on 02/27/2023) 08/01/2023: Takes 2 tablets as needed for neck pain (sporadic/infrequent)     meloxicam (MOBIC) 15 MG tablet Take 1 tablet (15 mg total) by mouth daily. (Patient not taking: Reported on 08/01/2023) 08/01/2023: As needed   methocarbamol (ROBAXIN) 500 MG tablet Take 1-2 tablets (500-1,000 mg total) by mouth every 8 (eight) hours as needed for muscle spasms. (Patient not taking: Reported on 08/01/2023) 08/01/2023: As needed   No facility-administered encounter medications on file as of 08/01/2023.   No Known Allergies   ROS: no f/c, no sinus pain. Some intermittent L ear discomfort. See HPI re: cough No chest pain, shortness of breath, wheezing. No vomiting or diarrhea.  Has had some mild nausea (wife did too, something they ate?)    PHYSICAL EXAM:  BP 128/76   Pulse 96   Temp 98.2 F (36.8 C) (Tympanic)   Ht 5\' 7"  (1.702 m)   Wt 161 lb 12.8 oz (73.4 kg)   BMI 25.34 kg/m   Well appearing male, with occasional dry cough noted during the visit. He is speaking easily and comfortably during visit HEENT: conjunctiva and sclera are clear, EOMI. Nasal mucosa is mild-mod edematous R>L, no mucus or  erythema noted. Sinuses nontender. OP clear. TM's and EAC's normal Neck: no lymphadenopathy or mass Heart: regular rate and rhythm, no murmur Lungs clear bilaterally, good air movement Extremities: no edema Psych normal mood, affect, hygiene and grooming Neuro: alert and oriented, cranial nerves grossly intact, normal gait    ASSESSMENT/PLAN:  Allergic rhinitis, unspecified seasonality, unspecified trigger - no e/o infection. Rec Flonase  given SE due to antihistamines. Guaifenesin recommended.  S/sx infection reviewed, f/u if sx persist/worsen - Plan: fluticasone (FLONASE) 50 MCG/ACT nasal spray  Acute cough - will give tessalon to use for the next few days to help with cough, allow him to get some relief of cough and sleep - Plan: benzonatate (TESSALON) 200 MG capsule  Drink plenty of water. Start taking Mucinex 12 hour twice daily.  This contains Guaifenesin which is an expectorant, which might help with the cough, even though it isn't a cough suppressant.  Take the benzonatate (tessalon) if needed for cough.  This helps numb the tickle of the cough--it is a DIFFERENT medication than what you took in the Dayquil, so hopefully won't cause side effects.  Start taking flonase (fluticasone), steroid nasal spray, 2 gentle sniffs into each nostril, once daily. This can take up to 2 weeks to have its full effect. Plan on using it for at least a couple of months, if this is helping (treats allergies, but isn't an antihistamine, so hopefully won't cause you any issues with side effects).  Contact us if you develop fever, discolored mucus, chest pain, shortness of breath. You may need to be re-evaluated. Please keep an eye on the color of the phlegm, which gives an indication for treatment (ie if has been clear/white and changes to yellow-green, this info is helpful).

## 2023-08-15 ENCOUNTER — Encounter: Payer: Self-pay | Admitting: Family Medicine

## 2023-08-20 NOTE — Progress Notes (Unsigned)
No chief complaint on file.  Patient presents to discuss symptoms, requesting cardiology referral. He sent the following message on 08/15/23: "I've been having some heart issues similar to what I saw you for about a year ago (heart palpitations, chest pain). I would like to have a referral sent over to [facility] cardiology at [facility] [phone]).  I'm happy to come in if you'd like to do an assessment beforehand, but considering the symptoms reappearing twice in a year and my family history (both mother and father have pacemakers, maternal grandfather has had 3 heart attacks, paternal grandfather died from heart attack), I'd like a referral to cardiology for peace of mind, regardless.   Chart was reviewed--the last time we discussed this was at 09/2021 visit, and he felt that the nortiptylene caused it, better when he stopped it. No mention of any recurrent episodes at his last 2 visits (he mentioned having symptoms appearing twice in the last year, that I'm aware of).  Thyroid wasn't checked at his CPE, as there was no mention of issues/palpitations.    Hypercholesterolemia:  His lipids were higher at his physical in April.  Low cholesterol diet was reviewed.  Changes to diet??  Component Ref Range & Units 5 mo ago 1 yr ago  Cholesterol, Total 100 - 199 mg/dL 956 High  213 High   Triglycerides 0 - 149 mg/dL 086 578  HDL >46 mg/dL 55 62  VLDL Cholesterol Cal 5 - 40 mg/dL 26 22  LDL Chol Calc (NIH) 0 - 99 mg/dL 962 High  952 High   Chol/HDL Ratio 0.0 - 5.0 ratio 4.3 3.5 CM     PMH, PSH, SH, FH reviewed  ***UPDATE--I only have defib/PM for FATHER, not MOTHER Heart attacks in both grandfathers is NOT on his FH, as reviewed at CPE in April  ***ADD  Per pt message: mother and father have pacemakers, maternal grandfather has had 3 heart attacks, paternal grandfather died from heart attack   ROS:   PHYSICAL EXAM:  There were no vitals taken for this visit.  Wt Readings from Last  3 Encounters:  08/01/23 161 lb 12.8 oz (73.4 kg)  02/27/23 170 lb 6.4 oz (77.3 kg)  04/26/22 162 lb 3.2 oz (73.6 kg)      ASSESSMENT/PLAN:  Refer to [facility] at [place] [phone]) per patient request  Repeat lipids, along with lipoprotein (a)??  Consider TSH if palpitations

## 2023-08-21 ENCOUNTER — Encounter: Payer: Self-pay | Admitting: Family Medicine

## 2023-08-21 ENCOUNTER — Ambulatory Visit: Payer: Managed Care, Other (non HMO) | Admitting: Family Medicine

## 2023-08-21 VITALS — BP 130/80 | HR 92 | Ht 67.0 in | Wt 165.2 lb

## 2023-08-21 DIAGNOSIS — Z8249 Family history of ischemic heart disease and other diseases of the circulatory system: Secondary | ICD-10-CM | POA: Diagnosis not present

## 2023-08-21 DIAGNOSIS — E7800 Pure hypercholesterolemia, unspecified: Secondary | ICD-10-CM

## 2023-08-21 DIAGNOSIS — Z859 Personal history of malignant neoplasm, unspecified: Secondary | ICD-10-CM

## 2023-08-21 DIAGNOSIS — R002 Palpitations: Secondary | ICD-10-CM

## 2023-08-21 DIAGNOSIS — R052 Subacute cough: Secondary | ICD-10-CM | POA: Diagnosis not present

## 2023-08-21 NOTE — Patient Instructions (Addendum)
We are referring you to [facility] cardiology. Go to [facility] for fasting labs.  Try taking the Pepcid twice daily (before breakfast and before dinner) to see if this helps the cough.  I suspect that you will be evaluated with a heart monitor, and possibly an echocardiogram.  Keep a journal of symptoms--frequency, pulse rate, how long it lasted, any potential triggers.  Go to [facility]) at your convenience for a chest x-ray due to your ongoing cough.

## 2023-09-02 ENCOUNTER — Ambulatory Visit
Admission: RE | Admit: 2023-09-02 | Discharge: 2023-09-02 | Disposition: A | Discharge status: Home / Self Care | Payer: Managed Care, Other (non HMO) | Source: Ambulatory Visit | Attending: Family Medicine | Admitting: Family Medicine

## 2023-09-02 DIAGNOSIS — R052 Subacute cough: Secondary | ICD-10-CM

## 2023-09-03 LAB — LIPID PANEL
Cholesterol, Total: 211 mg/dL — ABNORMAL HIGH (ref 100–199)
HDL: 49 mg/dL (ref >39)
LDL CALC COMMENT:: 4.3 ratio (ref 0.0–5.0)
LDL Chol Calc (NIH): 131 mg/dL — ABNORMAL HIGH (ref 0–99)
Triglycerides: 172 mg/dL — ABNORMAL HIGH (ref 0–149)
VLDL Cholesterol Cal: 31 mg/dL (ref 5–40)

## 2023-09-03 LAB — COMPREHENSIVE METABOLIC PANEL
ALT: 37 IU/L (ref 0–44)
AST: 30 IU/L (ref 0–40)
Albumin: 4.6 g/dL (ref 4.1–5.1)
Alkaline Phosphatase: 84 IU/L (ref 44–121)
BUN/Creatinine Ratio: 15 (ref 9–20)
BUN: 15 mg/dL (ref 6–20)
Bilirubin Total: 0.5 mg/dL (ref 0.0–1.2)
CO2: 26 mmol/L (ref 20–29)
Calcium: 9.6 mg/dL (ref 8.7–10.2)
Chloride: 102 mmol/L (ref 96–106)
Creatinine, Ser: 0.99 mg/dL (ref 0.76–1.27)
Globulin, Total: 2.8 g/dL (ref 1.5–4.5)
Glucose: 100 mg/dL — ABNORMAL HIGH (ref 70–99)
Potassium: 4.4 mmol/L (ref 3.5–5.2)
Sodium: 141 mmol/L (ref 134–144)
Total Protein: 7.4 g/dL (ref 6.0–8.5)
eGFR: 100 mL/min/{1.73_m2} (ref >59)

## 2023-09-03 LAB — LIPOPROTEIN A (LPA)

## 2023-09-03 LAB — CBC WITH DIFFERENTIAL/PLATELET
Basophils Absolute: 0 10*3/uL (ref 0.0–0.2)
Basos: 0 %
EOS (ABSOLUTE): 0.1 10*3/uL (ref 0.0–0.4)
Eos: 2 %
Hematocrit: 46.4 % (ref 37.5–51.0)
Hemoglobin: 15.6 g/dL (ref 13.0–17.7)
Immature Grans (Abs): 0 10*3/uL (ref 0.0–0.1)
Immature Granulocytes: 0 %
Lymphocytes Absolute: 1.5 10*3/uL (ref 0.7–3.1)
Lymphs: 30 %
MCH: 30.5 pg (ref 26.6–33.0)
MCHC: 33.6 g/dL (ref 31.5–35.7)
MCV: 91 fL (ref 79–97)
Monocytes Absolute: 0.5 10*3/uL (ref 0.1–0.9)
Monocytes: 10 %
Neutrophils Absolute: 2.8 10*3/uL (ref 1.4–7.0)
Neutrophils: 58 %
Platelets: 233 10*3/uL (ref 150–450)
RBC: 5.12 x10E6/uL (ref 4.14–5.80)
RDW: 13.1 % (ref 11.6–15.4)
WBC: 4.9 10*3/uL (ref 3.4–10.8)

## 2023-09-03 LAB — TSH: TSH: 1.03 u[IU]/mL (ref 0.450–4.500)

## 2023-09-03 LAB — HIGH SENSITIVITY CRP: CRP, High Sensitivity: 4.97 mg/L — ABNORMAL HIGH (ref 0.00–3.00)

## 2023-09-03 LAB — BETA HCG QUANT (REF LAB): hCG Quant: <1 m[IU]/mL (ref 0–3)

## 2023-11-18 ENCOUNTER — Other Ambulatory Visit (INDEPENDENT_AMBULATORY_CARE_PROVIDER_SITE_OTHER): Payer: Managed Care, Other (non HMO)

## 2023-11-18 DIAGNOSIS — Z23 Encounter for immunization: Secondary | ICD-10-CM

## 2024-01-23 ENCOUNTER — Encounter: Payer: Self-pay | Admitting: Family Medicine

## 2024-01-23 MED ORDER — PREGABALIN 150 MG PO CAPS: ORAL_CAPSULE | ORAL | 0 refills | Status: DC

## 2024-03-03 ENCOUNTER — Encounter: Payer: Self-pay | Admitting: *Deleted

## 2024-03-03 ENCOUNTER — Encounter: Payer: Self-pay | Admitting: Family Medicine

## 2024-03-03 NOTE — Progress Notes (Unsigned)
 No chief complaint on file.   Alexander Rivera is a 38 y.o. male who presents for a complete physical.   He has the following concerns:  Hyperlipidemia: Cholesterol was noted to be higher at his physical last year. At that time he had reported recently starting to eat chicken, after being vegan for over a decade. He had also reported gaining weight. He was eating an everything bagel and cream cheese for breakfast daily. A lot of rice, potatoes.  He was see last in October, to discuss his lipids, and with complaints of palpitations, requesting referral to [facility]. At that time he reported eating very healthy. Doesn't eat much dairy due to his wife's allergy. Some cheese on a sandwich 1-2x/week. No fast food, only poultry (air fryer). Uses oatmilk.    Component Ref Range & Units (hover) 6 mo ago (09/02/23) 1 yr ago (02/27/23) 1 yr ago (03/06/22)  Cholesterol, Total 211 High  237 High  218 High   Triglycerides 172 High  146 124  HDL 49 55 62  VLDL Cholesterol Cal 31 26 22   LDL Chol Calc (NIH) 131 High  156 High  134 High   Chol/HDL Ratio 4.3 4.3 CM 3.5 CM   Component Ref Range & Units (hover) 6 mo ago  Lipoprotein (a) <8.4   Component Ref Range & Units (hover) 6 mo ago  CRP, High Sensitivity 4.97 High   Comment:          Relative Risk for Future Cardiovascular Event                              Low                 <1.00                              Average       1.00 - 3.00                              High                >3.00   He saw the cardiologist in December, and was sent for echo, heart monitor and sleep study. Heart monitor (x2 weeks): Conclusions:  Likely no significant arrhythmia detected  2 (short) episodes of wide complex tachycardia (likely NSVT) were  incidentally noted lasting up to 6 beats and not associated with  symptomatic triggers  6 total symptomatic triggers recorded correlating to normal sinus  rhythm/mild sinus tachycardia and sinus rhythm with  ectopic beats  Overall burden of ectopic beats (PACs, PVCs) is low (<1.0%)     Migraines--had been under the care of neuro (Dr. Festus Hubert).  His headaches improved after drinking more water and doing home PT for his neck.  He hasn't had any migraines.  He has Nurtec to use prn (from 2022).  Previously reported chronic/frequent "upset stomach"--thinks related to ibuprofen  use.  He no longer uses ibuprofen  regularly, just 1-2x/week. He takes pepcid  along with it and denies any GI issues.  He has seen ortho (Dr. Azzie Bollman at [facility]) for a bulging disk in his neck.  Failed injection (C4 area per pt, by Dr. Ibazebo).  Doing home PT exercise program as recommended by ortho has been helpful.   Currently his neck is fine. Every few months  he will wake up with neck pain and barely move his neck.  Stretches, NSAID and muscle relaxants help. He has been taking Lyrica  for the last couple of years, tolerating without side effects, and thinks it helps some. Steroids (or NSAIDs) and muscle relaxants have helped with flares.  Last flare was ***   Possible food allergy:  He was sent to allergist for evaluation.  Lab testing (not skin) in 04/2022 revealed +allergy tests for hornet, yellow jacket, dust mites, trees. Neg alpha gal panel, mango, paprika, black and chili peppers, tryptase negative (r/o mast cell dz). He hasn't had any further reactions or rashes since then.  Has avoided [place] foods and use of capsaicin pads for pain, which he felt triggered the hives in the past.  Hasn't needed to use the epi-pen.   Immunization History  Administered Date(s) Administered   DTaP 04/09/1985, 06/11/1985, 10/16/1985, 05/31/1987, 06/25/1990   Hepatitis A 05/25/2009, 06/27/2009   Hepatitis B 04/29/2009, 05/25/2009, 06/27/2009   IPV 04/09/1985, 05/31/1987, 06/06/1990   Influenza Split 08/28/2011   Influenza, Seasonal, Injecte, Preservative Fre 11/18/2023   Influenza-Unspecified 12/06/2018, 09/09/2021   MMR  02/23/1987, 05/21/2011   PFIZER(Purple Top)SARS-COV-2 Vaccination 12/10/2019, 12/31/2019, 08/31/2020   Pfizer Covid-19 Vaccine Bivalent Booster 31yrs & up 08/24/2021   Pfizer(Comirnaty)Fall Seasonal Vaccine 12 years and older 08/08/2022   Td 08/25/1999   Tdap 04/29/2009, 02/21/2022   Varicella 06/22/1996   He was noted to have negative varicella titers in 2023, recommended varicella vaccine x2. He did not schedule NV for this as recommended. Last colonoscopy: never Last PSA: never Dentist: went once in the past year ([facility]). ***  Ophtho: yearly, has glasses (Dr. Geralyn Knee) Exercise:   Walks in the neighborhood with his wife, at least 3-4 days/week x 30-40 mins. 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  He had c-met, cbc, lipids, TSH, hcg, hs-CRP and Lp(a)  in 08/2023.    PMH, PSH, SH and FH were reviewed and updated  Wife with mast cell disorder, on immunosuppressants.   ROS:  The patient denies anorexia, fever, decreased hearing, ear pain, hoarseness, chest pain, palpitations, dizziness, syncope, dyspnea on exertion, cough, edema, nausea, vomiting, diarrhea, constipation, melena, hematochezia, indigestion/heartburn, hematuria, incontinence, erectile dysfunction, nocturia, weakened urine stream, dysuria, genital lesions, joint pains, weakness, tremor, suspicious skin lesions, depression, anxiety, abnormal bleeding/bruising, or enlarged lymph nodes. Headaches resolved R neck pain, intermittent tingling in the R arm, when neck pain flaring. Denies weakness Trouble sleeping--wakes up at 4am, sometimes can't get back to sleep. Improved some since on Lyrica  Sleeps better when he exercises. Denies heartburn, dysphagia. Vision changes--prescription kept changing.  Early stages of keratoconus.  He elected to continue monitoring (surgery was $5K) Weight    PHYSICAL EXAM:  There were no vitals taken for this visit.  Wt Readings from Last 3 Encounters:  08/21/23 165 lb 3.2 oz (74.9 kg)   08/01/23 161 lb 12.8 oz (73.4 kg)  02/27/23 170 lb 6.4 oz (77.3 kg)  02/27/23 170 lb 6.4 oz (77.3 kg)  General Appearance:    Alert, cooperative, no distress, appears stated age  Head:    Normocephalic, without obvious abnormality, atraumatic  Eyes:    PERRL, conjunctiva/corneas clear, EOM's intact, fundi    benign  Ears:    Normal TM's and external ear canals  Nose:   Nares normal, mucosa normal, no drainage or sinus   tenderness  Throat:   Lips, mucosa, and tongue normal; teeth and gums normal  Neck:   Supple, no lymphadenopathy;  thyroid:  no enlargement/ tenderness/nodules; no carotid bruit or JVD  Back:    Spine nontender, no curvature, ROM normal, no CVA     tenderness  Lungs:     Clear to auscultation bilaterally without wheezes, rales or     ronchi; respirations unlabored  Chest Wall:    No tenderness. WHSS   Heart:    Regular rate and rhythm, S1 and S2 normal, no murmur, rub or gallop  Breast Exam:    No chest wall tenderness, masses or gynecomastia  Abdomen:     Soft, non-tender, nondistended, normoactive bowel sounds,    no masses, no hepatosplenomegaly  Genitalia:    Normal male external genitalia without lesions, circumcised.  Testicles without masses.  No inguinal hernias.  Rectal:   Deferred due to age <40 and lack of symptoms  Extremities:   No clubbing, cyanosis or edema  Pulses:   2+ and symmetric all extremities  Skin:   Skin color, texture, turgor normal, no rashes.  3.5 mm nevus at left inner knee, somewhat irritation/raised, uniform brown in color. ***  Lymph nodes:   Cervical, supraclavicular, and inguinal nodes normal  Neurologic:   CNII-XII intact, normal strength, sensation and gait; reflexes 2+ and symmetric throughout          Psych:   Normal mood, affect, hygiene and grooming.     ***UPDATE mole at L inner knee    ASSESSMENT/PLAN:  Did he have sleep study or echo from [facility] cardiology? (Ordered in December--I only see result for heart monitor)  Did  they discuss him getting varicella vaccine with wife's allergist?  Consider lipids, if wants rechecked   Recommended at least 30 minutes of aerobic activity at least 5 days/week; proper sunscreen use reviewed; healthy diet and alcohol recommendations (less than or equal to 2 drinks/day) reviewed; regular seatbelt use; changing batteries in smoke detectors. Self-testicular exams. Immunization recommendations discussed--flu shots are recommended yearly.   Varicella was non-immune. Vaccine recommended. Prefers to consult with wife's allergist, given her immune status, to ensure that he wouldn't be putting her at risk.  Aware that it is 2 vaccines 1 month apart.   Discussed/educated in detail (declined at first, because not around kids--discussed that he is susceptible if exposed to shingles also; varicella as an adult has more risks than as young child. All questions answered). UPDATE ***  Colonoscopy recommendations reviewed, age 32.   F/u 1 year, sooner prn.

## 2024-03-03 NOTE — Patient Instructions (Incomplete)

## 2024-03-04 ENCOUNTER — Ambulatory Visit: Payer: Managed Care, Other (non HMO) | Admitting: Family Medicine

## 2024-03-04 ENCOUNTER — Encounter: Payer: Self-pay | Admitting: Family Medicine

## 2024-03-04 VITALS — BP 110/72 | HR 84 | Ht 68.0 in | Wt 161.2 lb

## 2024-03-04 DIAGNOSIS — Z0000 Encounter for general adult medical examination without abnormal findings: Secondary | ICD-10-CM

## 2024-03-04 DIAGNOSIS — R0683 Snoring: Secondary | ICD-10-CM | POA: Diagnosis not present

## 2024-03-04 DIAGNOSIS — G478 Other sleep disorders: Secondary | ICD-10-CM | POA: Diagnosis not present

## 2024-03-04 DIAGNOSIS — R002 Palpitations: Secondary | ICD-10-CM

## 2024-03-04 DIAGNOSIS — Z23 Encounter for immunization: Secondary | ICD-10-CM

## 2024-03-04 DIAGNOSIS — E7800 Pure hypercholesterolemia, unspecified: Secondary | ICD-10-CM

## 2024-03-04 DIAGNOSIS — K219 Gastro-esophageal reflux disease without esophagitis: Secondary | ICD-10-CM

## 2024-03-04 DIAGNOSIS — R400 Somnolence: Secondary | ICD-10-CM

## 2024-03-04 DIAGNOSIS — R7982 Elevated C-reactive protein (CRP): Secondary | ICD-10-CM

## 2024-03-04 DIAGNOSIS — Z859 Personal history of malignant neoplasm, unspecified: Secondary | ICD-10-CM

## 2024-03-05 ENCOUNTER — Encounter: Payer: Self-pay | Admitting: Family Medicine

## 2024-03-05 LAB — LIPID PANEL
Chol/HDL Ratio: 3.9 ratio (ref 0.0–5.0)
Cholesterol, Total: 209 mg/dL — ABNORMAL HIGH (ref 100–199)
HDL: 54 mg/dL (ref >39)
LDL Chol Calc (NIH): 132 mg/dL — ABNORMAL HIGH (ref 0–99)
Triglycerides: 129 mg/dL (ref 0–149)
VLDL Cholesterol Cal: 23 mg/dL (ref 5–40)

## 2024-04-06 ENCOUNTER — Ambulatory Visit (HOSPITAL_BASED_OUTPATIENT_CLINIC_OR_DEPARTMENT_OTHER)

## 2024-04-20 ENCOUNTER — Ambulatory Visit (HOSPITAL_BASED_OUTPATIENT_CLINIC_OR_DEPARTMENT_OTHER): Attending: Family Medicine | Admitting: Internal Medicine

## 2024-04-20 DIAGNOSIS — G478 Other sleep disorders: Secondary | ICD-10-CM | POA: Diagnosis present

## 2024-04-20 DIAGNOSIS — R400 Somnolence: Secondary | ICD-10-CM | POA: Diagnosis present

## 2024-04-20 DIAGNOSIS — G4733 Obstructive sleep apnea (adult) (pediatric): Secondary | ICD-10-CM | POA: Insufficient documentation

## 2024-04-20 DIAGNOSIS — R0683 Snoring: Secondary | ICD-10-CM | POA: Insufficient documentation

## 2024-04-25 DIAGNOSIS — G4733 Obstructive sleep apnea (adult) (pediatric): Secondary | ICD-10-CM | POA: Diagnosis not present

## 2024-04-25 DIAGNOSIS — G478 Other sleep disorders: Secondary | ICD-10-CM | POA: Diagnosis not present

## 2024-04-25 NOTE — Procedures (Signed)
 [facility] [address] [place], [place] [address] Tel: [phone]   Fax: [phone]  Home Sleep Test Interpretation  Patient Name: Alexander Rivera, Alexander Rivera Date: 04/20/2024  Date of Birth: Jul 20, 1986 Study Type: HST  Age: 38 year MRN #: [id number]  Sex: Male Interpreting Physician: Alexander Rivera, [id number]  Height: 5' 8 Referring Physician: Hurley Rivera  Weight: 161.0 lbs Recording Tech: [facility] RRT RPSGT RST  BMI: 24.7 Scoring Tech: [facility] RPSGT RST  ESS: 6 Neck Size: 14   Indications for Polysomnography The patient is a 38 year-old Male who is 5' 8 and weighs 161.0 lbs. His BMI equals 24.7.  A home sleep apnea test was performed to evaluate for -.osa  Medication  No Data.   Polysomnogram Data A home sleep test recorded the standard physiologic parameters including EKG, nasal and oral airflow.  Respiratory parameters of chest and abdominal movements were recorded with Respiratory Inductance Plethysmography belts.  Oxygen saturation was recorded by pulse oximetry.   Study Architecture The total recording time of the polysomnogram was 392.1 minutes.  The total monitoring time was 392.5 minutes.  Time spent in Supine position was 145.0 minutes.   Respiratory Events The study revealed a presence of 14 obstructive, 11 central, and - mixed apneas resulting in an Apnea index of 3.8 events per hour.  There were 68 hypopneas (>=3% desaturation and/or arousal) resulting in an Apnea\Hypopnea Index (AHI >=3% desaturation and/or arousal) of 14.2 events per hour.  There were 17 hypopneas (>=4% desaturation) resulting in an Apnea\Hypopnea Index (AHI >=4% desaturation) of 6.4 events per hour.  There were - Respiratory Effort Related Arousals resulting in a RERA index of - events per hour. The Respiratory Disturbance Index is 14.2 events per hour.  The snore index was 6.1 events per hour.  Mean oxygen saturation was 95.9%.  The lowest oxygen  saturation during monitoring time was 89.0%.  Time spent <=88% oxygen saturation was - minutes (-).  Cardiac Summary The average pulse rate was 78.0 bpm.  The minimum pulse rate was 50.0 bpm while the maximum pulse rate was 109.0 bpm.  Cardiac rhythm was normal  Comment: Mild to moderate obstructive sleep apnea, AHI (3%) 14.2/hr. Snoring with oxygen desaturation to a nadir of 89%, mean 95.9%.  Diagnosis: Obstructive sleep apnea   Recommendations: Suggest autopap or CPAP titration sleep study.   This study was personally reviewed and electronically signed by: Dr. RAMA JONETTA [facility] Accredited Board Certified in Sleep Medicine Date/Time: 04/25/24   1:32   Study Overview  Recording Time: 672.1 min. Monitoring Time: 392.5 min.  Analysis Start:  11:40:18 PM Supine Time: 145.0 min.  Analysis Stop:  06:12:23 AM     Study Summary   Count Index Longest Event Duration  Apneas & Hypopneas: 93 14.2  Apneas: 31.1 sec.     Hypopneas: 46.1 sec.  RERAs: - - - sec.  Desaturations: 95 14.5 124.0 sec.  Snores: 40 6.1 28.8 sec.    Minimum Oxygen Saturation: 89.0%    Respiratory Summary   Total Duration Supine Non-Supine   Count Index Average Longest Count Index Count Index  Obstructive Apnea 14 2.1 19.3 31.1 5 2.1 9 2.2   Mixed Apnea - - - - - - - -   Central Apnea 11 1.7 17.2 21.0 - - 11 2.7   Total Apneas 25 3.8 18.4 31.1 5 2.1 20 4.8            Hypopneas 3%  68 10.4 N.A. N.A. 18 7.4 50 12.1   Apneas & Hyp. 3% 93 14.2 N.A. N.A. 23 9.5 70 17.0            Hypopneas 4% 17 2.6 N.A. N.A. 7 2.9 10 2.4  Apneas & Hyp. 4% 42 6.4 N.A. N.A. 12 5.0 30 7.3             RERAs - - - - - - - -  RDI 94 14.4 N.A. N.A. 23 9.5 71 17.2   Oxygen Saturation Summary   Total Supine Non-Supine  Average SpO2 95.9% 95.9% 95.9%  Minimum SpO2 89.0% 89.0% 90.0%   Maximum SpO2 99.0% 99.0% 99.0%   Oxygen Saturation Distribution  Range (%) Time in range (min) Time in range (%)  90.0 - 100.0 391.3 99.9%  80.0 -  90.0 0.5 0.1%  70.0 - 80.0 - -  60.0 - 70.0 - -  50.0 - 60.0 - -  0.0 - 50.0 - -  Time Spent <=88% SpO2  Range (%) Time in range (min) Time in range (%)  0.0 - 88.0 - -  Cardiac Summary   Total Supine Non-Supine  Average Pulse Rate (BPM) 78.0 81.4 76.0  Minimum Pulse Rate (BPM) 50.0 67.0 50.0  Maximum Pulse Rate (BPM) 109.0 105.0 109.0                      Technologist Comments  -                         [facility], Biomedical engineer of Sleep Medicine  ELECTRONICALLY SIGNED ON:  04/25/2024, 1:28 PM [facility] PH: [phone]   FX: [phone] ACCREDITED BY THE [employer] OF SLE[employer]IC[employer]

## 2024-04-27 NOTE — Addendum Note (Signed)
 Addended by: LATTIE CARLO BROCKS on: 04/27/2024 04:37 PM   Modules accepted: Orders

## 2024-04-27 NOTE — Progress Notes (Signed)
 Left voicemail for patient

## 2024-04-28 ENCOUNTER — Encounter: Payer: Self-pay | Admitting: Family Medicine

## 2024-04-28 ENCOUNTER — Telehealth: Payer: Self-pay

## 2024-04-28 ENCOUNTER — Telehealth: Payer: Self-pay | Admitting: Family Medicine

## 2024-04-28 DIAGNOSIS — M62838 Other muscle spasm: Secondary | ICD-10-CM

## 2024-04-28 MED ORDER — PREGABALIN 150 MG PO CAPS: ORAL_CAPSULE | ORAL | 1 refills | Status: DC

## 2024-04-28 MED ORDER — METHOCARBAMOL 500 MG PO TABS: 500.0000 mg | ORAL_TABLET | Freq: Three times a day (TID) | ORAL | 0 refills | Status: AC | PRN

## 2024-04-28 NOTE — Telephone Encounter (Signed)
 Pt returned Riley's call, please call him back regarding sleep study

## 2024-04-28 NOTE — Telephone Encounter (Signed)
-----   Message from Annabelle DELENA Fetters sent at 04/27/2024  8:45 AM EDT ----- Please advise pt that sleep study showed mild-mod obstructive sleep apnea.  Trial of CPAP with autopap is recommended.  Please place referral for this. Thanks ----- Message ----- From: Neysa Reggy BIRCH, MD Sent: 04/25/2024   1:34 PM EDT To: Annabelle Fetters, MD

## 2024-04-28 NOTE — Telephone Encounter (Signed)
 Talked to patient and let him know his results from his sleep study and that referral was put in.

## 2024-05-07 ENCOUNTER — Other Ambulatory Visit (HOSPITAL_BASED_OUTPATIENT_CLINIC_OR_DEPARTMENT_OTHER)

## 2024-08-05 ENCOUNTER — Encounter: Payer: Self-pay | Admitting: Family Medicine

## 2024-09-16 ENCOUNTER — Ambulatory Visit (INDEPENDENT_AMBULATORY_CARE_PROVIDER_SITE_OTHER)

## 2024-09-16 DIAGNOSIS — Z23 Encounter for immunization: Secondary | ICD-10-CM | POA: Diagnosis not present

## 2024-11-09 ENCOUNTER — Encounter: Payer: Self-pay | Admitting: Family Medicine

## 2024-11-09 MED ORDER — PREGABALIN 150 MG PO CAPS: ORAL_CAPSULE | ORAL | 1 refills | Status: AC

## 2025-03-31 ENCOUNTER — Encounter: Payer: Self-pay | Admitting: Family Medicine
